# Patient Record
Sex: Male | Born: 1955 | Race: White | Hispanic: No | Marital: Single | Smoking: Former smoker
Health system: Southern US, Community
[De-identification: ages and names within clinical notes are randomized; demographics above are authoritative.]

## PROBLEM LIST (undated history)

## (undated) DIAGNOSIS — I219 Acute myocardial infarction, unspecified: Secondary | ICD-10-CM

## (undated) DIAGNOSIS — D126 Benign neoplasm of colon, unspecified: Secondary | ICD-10-CM

## (undated) DIAGNOSIS — E785 Hyperlipidemia, unspecified: Secondary | ICD-10-CM

## (undated) DIAGNOSIS — T7840XA Allergy, unspecified, initial encounter: Secondary | ICD-10-CM

## (undated) DIAGNOSIS — I2510 Atherosclerotic heart disease of native coronary artery without angina pectoris: Secondary | ICD-10-CM

## (undated) DIAGNOSIS — I10 Essential (primary) hypertension: Secondary | ICD-10-CM

## (undated) HISTORY — DX: Allergy, unspecified, initial encounter: T78.40XA

## (undated) HISTORY — DX: Acute myocardial infarction, unspecified: I21.9

## (undated) HISTORY — PX: CARDIAC CATHETERIZATION: SHX172

## (undated) HISTORY — DX: Essential (primary) hypertension: I10

## (undated) HISTORY — DX: Hyperlipidemia, unspecified: E78.5

## (undated) HISTORY — DX: Atherosclerotic heart disease of native coronary artery without angina pectoris: I25.10

## (undated) HISTORY — PX: VARICOSE VEIN SURGERY: SHX832

## (undated) HISTORY — DX: Benign neoplasm of colon, unspecified: D12.6

---

## 2000-08-22 HISTORY — PX: INGUINAL HERNIA REPAIR: SUR1180

## 2001-04-04 ENCOUNTER — Ambulatory Visit (HOSPITAL_COMMUNITY): Admission: RE | Admit: 2001-04-04 | Discharge: 2001-04-04 | Payer: Self-pay | Admitting: *Deleted

## 2005-01-07 ENCOUNTER — Inpatient Hospital Stay (HOSPITAL_COMMUNITY): Admission: EM | Admit: 2005-01-07 | Discharge: 2005-01-10 | Payer: Self-pay | Admitting: Emergency Medicine

## 2005-01-07 ENCOUNTER — Ambulatory Visit: Payer: Self-pay | Admitting: *Deleted

## 2005-01-20 ENCOUNTER — Ambulatory Visit: Payer: Self-pay | Admitting: Internal Medicine

## 2005-07-27 ENCOUNTER — Ambulatory Visit: Payer: Self-pay | Admitting: Family Medicine

## 2005-08-22 DIAGNOSIS — I219 Acute myocardial infarction, unspecified: Secondary | ICD-10-CM

## 2005-08-22 HISTORY — PX: INGUINAL HERNIA REPAIR: SUR1180

## 2005-08-22 HISTORY — DX: Acute myocardial infarction, unspecified: I21.9

## 2005-09-16 ENCOUNTER — Ambulatory Visit: Payer: Self-pay | Admitting: Cardiology

## 2005-09-16 ENCOUNTER — Ambulatory Visit: Payer: Self-pay | Admitting: Internal Medicine

## 2005-09-28 ENCOUNTER — Ambulatory Visit: Payer: Self-pay | Admitting: Family Medicine

## 2005-10-26 ENCOUNTER — Ambulatory Visit (HOSPITAL_COMMUNITY): Admission: RE | Admit: 2005-10-26 | Discharge: 2005-10-26 | Payer: Self-pay | Admitting: *Deleted

## 2005-11-16 ENCOUNTER — Ambulatory Visit (HOSPITAL_COMMUNITY): Admission: RE | Admit: 2005-11-16 | Discharge: 2005-11-16 | Payer: Self-pay | Admitting: *Deleted

## 2005-12-13 ENCOUNTER — Ambulatory Visit: Payer: Self-pay | Admitting: Family Medicine

## 2006-04-17 ENCOUNTER — Ambulatory Visit: Payer: Self-pay | Admitting: Family Medicine

## 2006-06-16 ENCOUNTER — Ambulatory Visit: Payer: Self-pay | Admitting: Family Medicine

## 2006-08-17 ENCOUNTER — Ambulatory Visit: Payer: Self-pay | Admitting: Family Medicine

## 2006-08-27 ENCOUNTER — Emergency Department (HOSPITAL_COMMUNITY): Admission: EM | Admit: 2006-08-27 | Discharge: 2006-08-27 | Payer: Self-pay | Admitting: Emergency Medicine

## 2006-08-31 ENCOUNTER — Ambulatory Visit: Payer: Self-pay | Admitting: Family Medicine

## 2006-09-07 ENCOUNTER — Ambulatory Visit: Payer: Self-pay | Admitting: Family Medicine

## 2006-09-14 ENCOUNTER — Ambulatory Visit: Payer: Self-pay | Admitting: Family Medicine

## 2006-09-27 ENCOUNTER — Ambulatory Visit: Payer: Self-pay | Admitting: Cardiology

## 2006-10-25 ENCOUNTER — Ambulatory Visit: Payer: Self-pay | Admitting: Family Medicine

## 2006-11-17 ENCOUNTER — Ambulatory Visit: Payer: Self-pay | Admitting: Family Medicine

## 2007-01-16 ENCOUNTER — Ambulatory Visit: Payer: Self-pay | Admitting: Family Medicine

## 2008-11-11 DIAGNOSIS — I2510 Atherosclerotic heart disease of native coronary artery without angina pectoris: Secondary | ICD-10-CM | POA: Insufficient documentation

## 2008-11-11 DIAGNOSIS — E785 Hyperlipidemia, unspecified: Secondary | ICD-10-CM | POA: Insufficient documentation

## 2008-11-11 DIAGNOSIS — I10 Essential (primary) hypertension: Secondary | ICD-10-CM | POA: Insufficient documentation

## 2008-11-12 ENCOUNTER — Ambulatory Visit: Payer: Self-pay | Admitting: Cardiology

## 2008-12-02 ENCOUNTER — Telehealth (INDEPENDENT_AMBULATORY_CARE_PROVIDER_SITE_OTHER): Payer: Self-pay | Admitting: *Deleted

## 2008-12-17 ENCOUNTER — Ambulatory Visit: Payer: Self-pay | Admitting: Vascular Surgery

## 2008-12-31 ENCOUNTER — Ambulatory Visit: Payer: Self-pay | Admitting: Vascular Surgery

## 2009-01-01 ENCOUNTER — Ambulatory Visit: Payer: Self-pay | Admitting: Vascular Surgery

## 2009-01-07 ENCOUNTER — Ambulatory Visit: Payer: Self-pay | Admitting: Vascular Surgery

## 2009-02-20 ENCOUNTER — Ambulatory Visit: Payer: Self-pay | Admitting: Vascular Surgery

## 2011-01-04 ENCOUNTER — Encounter: Payer: Self-pay | Admitting: Cardiology

## 2011-01-04 NOTE — Procedures (Signed)
DUPLEX DEEP VENOUS EXAM - LOWER EXTREMITY   INDICATION:  Follow up left greater saphenous vein ablation.   HISTORY:  Edema:  Left lower extremity.  Trauma/Surgery:  Left greater saphenous vein ablation with stab  phlebectomy on 12/31/08 by Dr. Arbie Cookey.  Pain:  Left thigh pain.  PE:  No.  Previous DVT:  No.  Anticoagulants:  Aspirin, Plavix.  Other:   DUPLEX EXAM:                CFV   SFV   PopV  PTV    GSV                R  L  R  L  R  L  R   L  R  L  Thrombosis    o  o     o     o      o     +  Spontaneous   +  +     +     +      +     0  Phasic        +  +     +     +      +     0  Augmentation  +  +     +     +      +     0  Compressible  +  +     +     +      +     0  Competent     +  +     +     +      +     0   Legend:  + - yes  o - no  p - partial  D - decreased   IMPRESSION:  1. No evidence of deep venous thrombosis in left lower extremity or      right common femoral vein.  2. Evidence of ablation of left greater saphenous vein from knee to      saphenofemoral junction with no evidence of extension into the      common femoral vein.  3. 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      _____________________________  Larina Earthly, M.D.   AS/MEDQ  D:  01/07/2009  T:  01/07/2009  Job:  [id number]

## 2011-01-04 NOTE — Consult Note (Signed)
NEW PATIENT CONSULTATION   Ricky [name], Ricky [name]  DOB:  [birth year]                                       12/17/2008  CHART#:[id number]   The patient presents today for evaluation of his left leg venous  hypertension and bleeding.  He is a pleasant 55 year old gentleman who  had 2 different episodes of bleeding from his medial ankle varicosities.  These were treated with compression.  EMS was called on the second  occasion in January to stop the bleeding.  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  He had no prior history of  superficial thrombophlebitis or deep venous thrombosis.   PAST MEDICAL HISTORY:  Significant for hypertension, elevated blood  pressure and elevated cholesterol.   SOCIAL HISTORY:  He is single.  He is not retired.  He does not smoke or  drink alcohol.   PHYSICAL EXAMINATION:  A well-developed, well-nourished white male  appearing stated age of 46.  His right leg is without varicosities.  Left leg has very large marked varicosities from his mid thigh extending  throughout his medial and lateral calf down on to his medial ankle.  He  does have multiple superficial varicosities in the ankle at the area of  bleeding.   He underwent vascular laboratory studies and this reveals gross reflux  throughout the saphenous vein.  He has no evidence of deep venous  reflux.   I discussed this at length with the patient.  He is an excellent  candidate for ablation of his great saphenous vein and stab phlebectomy  of his multiple large tributaries for reduction of his venous  hypertension.  I explained the procedure to the patient including the  fact that this is done in the office under local anesthesia and that he  would walk in, have the treatment and then be able to be ambulatory  immediately.  He really has not had pain associated with this and reason  for treatment would be to reduce his risk for rebleeding.  I did  explain  that we would use sclerotherapy to treat the areas that had bled to  further reduce his risk for continued bleeding difficulty.  He wishes to  proceed and we will do this as soon as we have ensured insurance  coverage.   Larina Earthly, M.D.  Electronically Signed   TFE/MEDQ  D:  12/17/2008  T:  12/18/2008  Job:  [id number]   cc:   Rollene Rotunda, MD, [facility]

## 2011-01-04 NOTE — Assessment & Plan Note (Signed)
OFFICE VISIT   Kipnis, Marik L  DOB:  1956/06/07                                       02/20/2009  CHART#:[id number]   Rishav Rockefeller presents today for followup of his laser ablation, stab  phlebectomy, and also sclerotherapy of tributary branches of his ankle  that had bleeding.  This procedure was done on Dec 31, 2008.  He did  well initially and continued to do quite well.  He does have the usual  amount of thickness over the sclerotherapy site with occlusion of the  same.  He has no discomfort in the area of prior bleeding with the  reticular and telangiectasias in his ankle have completely thrombosed  and resolved with sclerotherapy.  I am quite pleased with his result as  is Mr. Vieth.  He will see [place] again on an as-needed basis.   Larina Earthly, M.D.  Electronically Signed   TFE/MEDQ  D:  02/20/2009  T:  02/24/2009  Job:  [id number]

## 2011-01-04 NOTE — Procedures (Signed)
LOWER EXTREMITY VENOUS REFLUX EXAM   INDICATION:  Left lower extremity varicose vein with pain and swelling.   EXAM:  Using color-flow imaging and pulse Doppler spectral analysis, the  left common femoral, superficial femoral, popliteal, posterior tibial,  greater and lesser saphenous veins are evaluated.  There is no evidence  suggesting deep venous insufficiency in the left lower extremity.   The left saphenofemoral junction is competent.  The left GSV is not  competent with the caliber as described below.   The left proximal short saphenous vein demonstrates competency.   GSV Diameter (used if found to be incompetent only)                                            Right    Left  Proximal Greater Saphenous Vein           cm       0.93 cm  Proximal-to-mid-thigh                     cm       0.93 cm  Mid thigh                                 cm       0.93 cm  Mid-distal thigh                          cm       0.93 cm  Distal thigh                              cm       0.33 cm  Knee                                      cm       0.40 cm   IMPRESSION:  1. Left greater saphenous vein reflux is identified with the caliber      ranging from 0.40 cm to 1.15 cm knee to groin.  2. The left greater saphenous vein is not aneurysmal.  3. The left greater saphenous vein is not tortuous.  4. The left deep venous system is competent.  5. The left lesser saphenous vein is competent.  6. No evidence of deep venous thrombosis noted in the left leg.    ___________________________________________  Larina Earthly, M.D.   MG/MEDQ  D:  12/17/2008  T:  12/17/2008  Job:  [id number]

## 2011-01-04 NOTE — Assessment & Plan Note (Signed)
[facility]                            CARDIOLOGY OFFICE NOTE   NAME:Ricky [name], Ricky [name]                       MRN:          [id number]  DATE:11/12/2008                            DOB:          03/28/1956    PRIMARY CARE PHYSICIAN:  Delaney Meigs, MD   REASON FOR PRESENTATION:  Evaluate the patient with coronary disease  with a non-Q-wave myocardial infarction.   HISTORY OF PRESENT ILLNESS:  The patient is now 55 years old.  He has  done well since I last saw him.  He remains active, working as a  Copy.  With this, he denies any chest discomfort, neck or arm  discomfort.  He has had no palpitations, presyncope, or syncope.  He has  had no PND or orthopnea.   He has had couple of episodes where small varicose veins in his foot  have blood.  He said this is fairly profuse.  He has had to call EMS  though he has not been hospitalized with this.   PAST MEDICAL HISTORY:  1. Nonobstructive coronary disease (circumflex 30% stenosed).  2. Well-preserved ejection fraction.  3. Dyslipidemia.  4. 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  5. Hypertension.  6. Inguinal hernia repair.   ALLERGIES:  None.   MEDICATIONS:  1. Aspirin 81 mg daily.  2. Plavix 75 mg daily.  3. Lopressor 25 mg b.i.d.  4. Diovan 320/25 daily.  5. Zetia 10 mg daily.  6. WelChol 625 mg daily.   REVIEW OF SYSTEMS:  As stated in the HPI and otherwise negative for  other systems.   PHYSICAL EXAMINATION:  GENERAL:  The patient is pleasant, in no  distress.  VITAL SIGNS:  Blood pressure 160/90, heart rate 59 and regular, weight  174 pounds, and body mass index 25.  HEENT:  Eyelids unremarkable.  Pupils equal, round, and reactive to  light.  Fundi not visualized.  Oral mucosa unremarkable.  NECK:  No jugular venous distention at 45 degrees.  Carotid upstroke  brisk and symmetric.  No bruits.  No thyromegaly.  LYMPHATICS:  No cervical,  axillary, or inguinal adenopathy.  LUNGS:  Clear to auscultation bilaterally.  BACK:  No costovertebral angle tenderness.  CHEST:  Unremarkable.  HEART:  PMI not displaced or sustained.  S1 and S2 within normal limits.  No S3, no S4.  No clicks, no rubs, no murmurs.  ABDOMEN:  Flat, positive bowel sounds, normal in frequency and pitch.  No bruits, no rebound, no guarding.  No midline pulsatile mass.  No  hepatomegaly.  No splenomegaly.  SKIN:  No rashes, no nodules.  EXTREMITIES:  2+ pulses throughout.  No edema, no cyanosis, no clubbing.  NEUROLOGIC:  Oriented to person, place, and time.  Cranial nerves II-XII  grossly intact.  Motor grossly intact.   EKG, sinus rhythm, rate 59, axis within normal limits, intervals within  normal limits, early transition lead V2, no acute ST-T wave changes.   ASSESSMENT AND PLAN:  1. Non-Q-wave myocardial infarction.  The patient is having no  symptoms.  No further cardiovascular testing is suggested.  He will      continue with the risk reduction.  2. Dyslipidemia.  This is followed by Dr. Lysbeth Galas.  The goal LDL less      than 100 and HDL greater than 50.  3. Hypertension.  His blood pressure is elevated today.  However, he      says he gets to check routinely at work and it is fine.  It is well      below 140/90.  Therefore, I will make no change to his medical      regimen.  4. Bleeding varicose veins.  I have given him a referral to the      vascular and vein specialists.  Since he has had 2 bleeding      episodes, a procedure to treat these may be indicated.  If felt      necessary, he could come off his Plavix though I would want him to      continue aspirin.  5. Followup.  I will see him again in about 18 months or sooner if      needed.     Rollene Rotunda, MD, [facility]  Electronically Signed    JH/MedQ  DD: 11/12/2008  DT: 11/13/2008  Job #: [id number]   cc:   Delaney Meigs, M.D.

## 2011-01-04 NOTE — Assessment & Plan Note (Signed)
OFFICE VISIT   [name], Ricky L  DOB:  July 25, 1956                                       01/07/2009  CHART#:[id number]   The patient presents today in 1 week followup for left leg laser  ablation and stab phlebectomy of tributary varicosities and  sclerotherapy of the area of telangiectasia around his ankles which had  caused prior bleeding.  He has the usual amount of discomfort over the  ablation site in his thigh.  He has minimal discomfort over the stab  phlebectomy sites in his calf.   He underwent duplex and this shows no evidence of DVT and does show  successful ablation from below his knee up to the saphenofemoral  junction.  I am pleased with his initial result and plan to see him  again in 6 weeks for final followup.   Larina Earthly, M.D.  Electronically Signed   TFE/MEDQ  D:  01/07/2009  T:  01/08/2009  Job:  [id number]   cc:   Rollene Rotunda, MD, [facility]

## 2011-01-05 ENCOUNTER — Ambulatory Visit (INDEPENDENT_AMBULATORY_CARE_PROVIDER_SITE_OTHER): Payer: Self-pay | Admitting: Cardiology

## 2011-01-05 ENCOUNTER — Encounter: Payer: Self-pay | Admitting: Cardiology

## 2011-01-05 DIAGNOSIS — E785 Hyperlipidemia, unspecified: Secondary | ICD-10-CM

## 2011-01-05 DIAGNOSIS — I10 Essential (primary) hypertension: Secondary | ICD-10-CM

## 2011-01-05 DIAGNOSIS — I2510 Atherosclerotic heart disease of native coronary artery without angina pectoris: Secondary | ICD-10-CM

## 2011-01-05 NOTE — Assessment & Plan Note (Signed)
The blood pressure is at target. No change in medications is indicated. We will continue with therapeutic lifestyle changes (TLC).  

## 2011-01-05 NOTE — Patient Instructions (Signed)
Follow up as needed  Continue current medications  

## 2011-01-05 NOTE — Assessment & Plan Note (Signed)
Per Josue Hector, MD

## 2011-01-05 NOTE — Progress Notes (Signed)
HPI The patient presents for followup of a previous non-Q-wave myocardial infarction in 2006. I reviewed the catheterization from that time and was found to have normal coronaries. I haven't seen him in a couple of years. In that time he has done well. He continues to work as a Copy.  He has had no cardiovascular symptoms. He denies any chest pressure, neck or arm discomfort. He has had no palpitations, presyncope or syncope. He has had no shortness of breath, PND or orthopnea. He has no weight gain or edema.  No Known Allergies  Current Outpatient Prescriptions  Medication Sig Dispense Refill  . aspirin 81 MG tablet Take 81 mg by mouth daily.        . clopidogrel (PLAVIX) 75 MG tablet Take 75 mg by mouth daily.        . colesevelam (WELCHOL) 625 MG tablet Take 1,875 mg by mouth. 3 po bid       . ezetimibe (ZETIA) 10 MG tablet Take 10 mg by mouth daily.        . metoprolol (LOPRESSOR) 50 MG tablet Take 50 mg by mouth 2 (two) times daily.        . Pitavastatin Calcium (LIVALO) 2 MG TABS Take by mouth. 1 po daily       . valsartan-hydrochlorothiazide (DIOVAN-HCT) 320-25 MG per tablet Take 1 tablet by mouth daily.          Past Medical History  Diagnosis Date  . CAD (coronary artery disease)     Normal coronaries (2006)  . Dyslipidemia   . Hypertension     Past Surgical History  Procedure Date  . Varicose vein surgery   . Inguinal hernia repair     ROS:  As stated in the HPI and negative for all other systems.  PHYSICAL EXAM BP 132/82  Pulse 63  Resp 16  Ht 5\' 8"  (1.727 m)  Wt 175 lb (79.379 kg)  BMI 26.61 kg/m2 GENERAL:  Well appearing HEENT:  Pupils equal round and reactive, fundi not visualized, oral mucosa unremarkable NECK:  No jugular venous distention, waveform within normal limits, carotid upstroke brisk and symmetric, no bruits, no thyromegaly LYMPHATICS:  No cervical, inguinal adenopathy LUNGS:  Clear to auscultation bilaterally BACK:  No CVA tenderness,  scoliosis CHEST:  Unremarkable HEART:  PMI not displaced or sustained,S1 and S2 within normal limits, no S3, no S4, no clicks, no rubs, no murmurs ABD:  Flat, positive bowel sounds normal in frequency in pitch, no bruits, no rebound, no guarding, no midline pulsatile mass, no hepatomegaly, no splenomegaly EXT:  2 plus pulses throughout, no edema, no cyanosis no clubbing SKIN:  No rashes no nodules NEURO:  Cranial nerves II through XII grossly intact, motor grossly intact throughout PSYCH:  Cognitively intact, oriented to person place and time  EKG:  Sinus rhythm, rate 62, early transition in lead V2, no acute ST-T wave changes  ASSESSMENT AND PLAN

## 2011-01-05 NOTE — Assessment & Plan Note (Signed)
The patient had no coronary disease though he had a non-Q-wave MI in the past. At this point no further testing is suggested. He can stop his Plavix but will continue with risk reduction.

## 2011-01-07 NOTE — Cardiovascular Report (Signed)
NAMEMANOLITO, JUREWICZ                ACCOUNT NO.:  [id number]   MEDICAL RECORD NO.:  [id number]          PATIENT TYPE:  INP   LOCATION:  3311                         FACILITY:  [facility]   PHYSICIAN:  Salvadore Farber, M.D. LHCDATE OF BIRTH:  08/18/1956   DATE OF PROCEDURE:  01/10/2005  DATE OF DISCHARGE:                              CARDIAC CATHETERIZATION   PROCEDURE:  Left heart catheterization, left ventriculography, coronary  angiography.   INDICATIONS:  Mr. Paulsen is a 55 year old gentleman with hypertension who  presented on Jan 07, 2005 with chest pain.  He ruled in for small mild ST  elevation myocardial infarction with troponin rising to 1.  He was referred  for diagnostic angiography.   PROCEDURAL TECHNIQUE:  Informed consent was obtained.  Under 1% lidocaine  local anesthesia a 5-French sheath was placed in the right common femoral  artery using the modified Seldinger technique.  Diagnostic angiography and  ventriculography were performed using JL4, no-torque right, and pigtail  catheters.  Patient tolerated the procedure well was transferred to the  holding room in stable condition.  Sheaths will be removed there.   COMPLICATIONS:  None.   FINDINGS:  1.  LV 131/4/10.  EF 65% without regional wall motion abnormalities.  2.  No aortic stenosis or mitral regurgitation.  3.  Left main:  Angiographically normal.  4.  LAD:  Large vessel wrapping the apex of the heart and supplied the      distal third of the inferior wall.  It gives rise to a single large      diagonal branch.  It is angiographically normal.  5.  Circumflex:  Large, dominant vessel giving rise to a single obtuse      marginal and the PDA.  It is angiographically normal.  6.  RCA:  Small, nondominant vessel.  There was catheter induced spasm at      the ostium which was relieved with intracoronary nitroglycerin.  The      vessel was angiographically normal.   </IMPRESSION/RECOMMENDATIONS/  Patient has  minimal, nonobstructive coronary disease.  I suspect that his  small non ST elevation myocardial infarction was caused either by plaque  rupture versus coronary spasm.  Will treat with combination of aspirin and  Plavix for a year per the CURE data.  Continue beta blocker.      WED/MEDQ  D:  01/10/2005  T:  01/10/2005  Job:  [id number]   cc:   Rollene Rotunda, M.D.   Ernestina Penna, M.D.  [address] [place]  [place] [address]  Fax: [phone]

## 2011-01-07 NOTE — Assessment & Plan Note (Signed)
[facility]                            CARDIOLOGY OFFICE NOTE   NAME:Ricky [name], Ricky [name]                       MRN:          [id number]  DATE:09/27/2006                            DOB:          1956/02/17    PRIMARY CARE PHYSICIAN:  Delaney Meigs, M.D.   REASON FOR PRESENTATION:  Evaluate patient with non-Q-wave myocardial  infarction.   HISTORY OF PRESENT ILLNESS:  Patient is 55 years old.  He presents for  yearly followup.  He had a history of a non-Q-wave myocardial infarction  in May, 2006 with nonobstructive coronary disease diagnosed at that  time.  Since then, he has been managed with risk reduction.  He had not  had any recurrent chest discomfort, neck or arm discomfort.  He had no  palpitations, presyncope, or syncope.  He has no PND or orthopnea.  He  works 12 hours a day.  He does not exercise routinely.   Of note, he did have an episode of acute joint pains last month and  presented to the ER.  It was felt possibly to be an acute arthritic  event.  He was treated with Percocet and Celebrex.  The patient did stop  his Lipitor, and symptoms slowly resolved.  He wonders if the drug might  not have been related, and he has not restarted it.   PAST MEDICAL HISTORY:  Nonobstructive coronary artery disease  (circumflex 30% stenosis).  Well-preserved ejection fraction,  dyslipidemia, congenital bony abnormality (there is no name associated  to this).  Hypertension.  Inguinal hernia repair.   ALLERGIES:  None.   CURRENT MEDICATIONS:  1. Aspirin 81 mg daily.  2. Plavix 75 mg daily.  3. Lopressor 25 mg b.i.d.  4. Diovan/HCT 160/12.5 daily.   REVIEW OF SYSTEMS:  As stated in the HPI, otherwise negative for other  systems.   PHYSICAL EXAMINATION:  GENERAL:  Patient is pleasant.  He is in no  distress.  VITAL SIGNS:  Blood pressure 142/92, heart rate 69 and regular.  Weight  162 pounds.  Body Mass Index 24.  HEENT:  Eyelids unremarkable.   Pupils are equal, round and reactive to  light.  Fundi not visualized.  NECK:  No jugular venous distention at 45 degrees.  Carotid upstroke  brisk and symmetric.  No bruits, no thyromegaly.  LYMPHATICS:  No cervical, axillary, or inguinal adenopathy.  LUNGS:  Clear to auscultation bilaterally.  BACK:  Severe scoliosis.  HEART:  PMI not displaced or sustained.  S1 and S2 within normal limits.  No S3, no S4, no clicks, rubs, or murmurs.  ABDOMEN:  Flat, positive bowel sounds.  Normal in frequency and pitch.  No bruits, rebound, guarding.  There are no midline pulsatile masses.  No hepatomegaly, splenomegaly.  SKIN:  No rashes, no nodules.  EXTREMITIES:  Pulses 2+ throughout.  No cyanosis, no clubbing, no edema.  NEUROLOGIC:  Alert and oriented to person, place, and time.  Cranial  nerves II-XII are grossly intact.  Motor grossly intact throughout.   EKG:  Sinus rhythm, rate 69.  Axis within normal limits.  Intervals  within normal limits.  No acute ST-T wave changes.   ASSESSMENT/PLAN:  1. Coronary artery disease:  Patient has no new symptoms consistent      with progression of his coronary disease.  We discussed risk      reduction.  I have given him a prescription for exercising five      days per week.  2. Dyslipidemia:  The patient has some congenital bony abnormality.  I      wonder if he is set up for arthritis and would suggest that this      probably was the problem more than his 10 mg of Lipitor.  He is      going to remain off of this and see Dr. Lysbeth Galas next month.  I would      suggest a trial of pravastatin as a next therapy or restarting the      Lipitor.  3. Followup:  Patient is to come back to this clinic every 18 months      or as needed.     Rollene Rotunda, MD, [facility]  Electronically Signed    JH/MedQ  DD: 09/27/2006  DT: 09/27/2006  Job #: [id number]   cc:   Delaney Meigs, M.D.

## 2011-01-07 NOTE — Op Note (Signed)
NAMEJAZ, Ricky [name]                ACCOUNT NO.:  [id number]   MEDICAL RECORD NO.:  [id number]          PATIENT TYPE:  AMB   LOCATION:  SDS                          FACILITY:  [facility]   PHYSICIAN:  Alfonse Ras, MD   DATE OF BIRTH:  06-19-1956   DATE OF PROCEDURE:  11/16/2005  DATE OF DISCHARGE:  11/16/2005                                 OPERATIVE REPORT   PREOPERATIVE DIAGNOSIS:  Left inguinal hernia.   POSTOPERATIVE DIAGNOSIS:  Left inguinal hernia.   PROCEDURES:  Left inguinal hernia repair with mesh.   SURGEON:  Alfonse Ras, MD   ANESTHESIA:  General.   DESCRIPTION:  The patient was taken to the operating room and placed in the  supine position.  After adequate general anesthesia was induced using  laryngeal mask, the left groin was prepped and draped in a normal sterile  fashion.  Using an oblique incision over the inguinal canal, I dissected  down on the external oblique fascia which was quite attenuated.  It was  opened along its fibers.  Spermatic cord was surrounded at the pubic  tubercle with a Penrose drain.  A direct hernia defect was identified, and  sac was dissected free and reduced into the abdominal cavity.  A fascial-  splitting incision was made because the fascia was quite tense and  attenuated on both sides.  The transversalis fascia was approximated to the  shelving edge of Cooper ligament and the inguinal ligament with interrupted  #1 Surgilon.  This closed the floor of Hesselbach triangle very nicely.  This was reinforced with a piece of Prolene mesh which was sutured with a 2-  0 Prolene from the pubic tubercle along the transversalis fascia, along the  inguinal ligament, split, and brought out lateral to the internal ring and  tacked laterally.  There was no evidence of an indirect hernia sac.  The  external oblique fascia was then closed as well as I could with a running 3-  0 Vicryl suture.  Skin was closed with staples.  Tissues were injected  with  0.5 Marcaine.  The patient tolerated the procedure well and went to PACU in  good condition.      Alfonse Ras, MD  Electronically Signed     KRE/MEDQ  D:  11/16/2005  T:  11/17/2005  Job:  [id number]

## 2011-01-07 NOTE — H&P (Signed)
NAMEJURIS, GOSNELL                ACCOUNT NO.:  [id number]   MEDICAL RECORD NO.:  [id number]          PATIENT TYPE:  INP   LOCATION:  3311                         FACILITY:  [facility]   PHYSICIAN:  Rollene Rotunda, M.D.   DATE OF BIRTH:  August 21, 1956   DATE OF ADMISSION:  01/07/2005  DATE OF DISCHARGE:                                HISTORY & PHYSICAL   PRIMARY CARE PHYSICIAN:  Dr. Christell Constant   CHIEF COMPLAINT:  Chest pain.   HISTORY OF PRESENT ILLNESS:  Ricky [name] is a 55 year old with past medical  history significant for hypertension who presents to the emergency room with  a substernal left-sided chest pain since 12:00 this afternoon.  Patient  stated that he was mowing his lawn today when after 30 minutes developed  some left-sided substernal chest pain.  Patient describes the chest pain as  a pressure rise.  Patient denies any radiation.  No shortness of breath.  No  nausea.  No vomiting.  No abdominal pain.  No headache.  Patient states that  he did have some fatigue.  Patient then went to his primary care physician's  office, Dr. Christell Constant, who then obtained an EKG, gave the patient two  nitroglycerin tablets sublingual which improved patient's chest pain.  Patient was then transferred by EMS to the [facility] Emergency  Room.  During transfer patient was given four baby aspirin and transferred.  Patient is currently without any chest pain.   PAST MEDICAL HISTORY:  1.  Hypertension.  2.  Prior catheterization note.  3.  CABG No   ALLERGIES:  None known drug allergies.   ALLERGIES:  No known drug allergies.  No allergies to contrast.  No  allergies to shellfish.   MEDICATIONS:  1.  Diovan/HCTZ 160/12.5 mg p.o. daily.  2.  Aspirin 325 mg p.o. daily.   SOCIAL HISTORY:  Patient lives in [place] with his mother.  Patient works  at [employer] and also at [employer].  Patient is single.  Denies any tobacco use.  No alcohol use.  No IV drug use.  No cocaine  drug  use.  The patient has taken aspirin from time to time.   FAMILY HISTORY:  Patient's mother is alive and healthy and 23 years old.  Only past medical history is cataract.  Patient's father passed away at age  46 and was died in a house fire.  Patient does have two sisters.  One is 52  status post breast cancer and the other is 50 who is healthy.   REVIEW OF SYSTEMS:  Pretty much unremarkable except stated per HPI which  includes the substernal left-sided chest pain, palpitations, sore throat x2  days, now resolved prior to admission.   PHYSICAL EXAMINATION:  VITAL SIGNS:  Temperature 97.7, pulse 92, respiratory  rate 18, blood pressure 149/79, saturating 98% on room air.  GENERAL:  Patient is well-developed, well-nourished white male.  HEENT:  Normocephalic, atraumatic.  Pupils are equal, round, and reactive to  light.  Extraocular movements intact.  Sclera is clear.  Tympanic membranes  are clear.  Oropharynx  is clear without exudate.  No erythema and moist.  NECK:  Supple with no lymphadenopathy, no JVD, no carotid bruits.  ABDOMEN:  Soft, nontender, nondistended.  Normal bowel sounds.  No rebound  and no guarding.  No hepatosplenomegaly.  CARDIOVASCULAR:  Heart is regular rate and rhythm with no murmurs, rubs, or  gallops.  Normal PMI.  No bruits.  No JVD.  Pulses 2+ equally and  bilaterally.  RESPIRATORY:  Lungs are clear to auscultation bilaterally without any  wheezes, rhonchi, or rales.  EXTREMITIES:  No clubbing, cyanosis, edema.  No rash.  No lesions . No  petechiae.  SKIN:  Without any rashes.  NEUROLOGIC:  Patient is alert and oriented x3.  Cranial nerves II-XII are  grossly intact.  5/5 upper extremity strength bilaterally.  5/5 lower  extremity strength bilaterally.  Normal sensation throughout.  Normal  cerebellar function.  BACK:  Patient has kyphosis on the right side of his back.   EKG had a rate of 98, normal sinus rhythm.  PR interval 142.  QRS interval   100.  QTc 447.  There was no evidence of hypertrophy and T-wave inversion in  leads V1 and V2.   ASSESSMENT/PLAN:  1.  Chest pain/acute coronary syndrome.  Patient with a positive troponin on      point of care markers of 0.19.  Patient will be admitted to the stepdown      unit.  Patient is currently chest pain-free.  Will cycle enzymes q.8h.      x3.  Start patient on Lopressor 5 mg intravenous x1 and Lopressor 25 mg      p.o. b.i.d.  Heparin drip per pharmacy.  Nitroglycerin drip starting at      5 mL per minute.  Will check a PT, PTT, INR.  Check a TSH, CMET, CBC.      Also, give the patient aspirin 325 mg p.o. daily.  Will check a fasting      lipid in the morning and patient will be scheduled for a cardiac      catheterization on Monday, Jan 10, 2005.  2.  Hypertension.  Patient will be maintained on Lopressor 25 mg p.o. b.i.d.      and also on a nitroglycerin drip.      DT/MEDQ  D:  01/07/2005  T:  01/07/2005  Job:  [id number]

## 2011-01-07 NOTE — Discharge Summary (Signed)
NAMEORYAN, Ricky [name]                ACCOUNT NO.:  [id number]   MEDICAL RECORD NO.:  [id number]          PATIENT TYPE:  INP   LOCATION:  3311                         FACILITY:  [facility]   PHYSICIAN:  Salvadore Farber, M.D. LHCDATE OF BIRTH:  Jun 15, 1956   DATE OF ADMISSION:  01/07/2005  DATE OF DISCHARGE:  01/10/2005                                 DISCHARGE SUMMARY   PRINCIPAL DIAGNOSIS:  Acute coronary syndrome/non ST elevation myocardial  infarction.   OTHER DIAGNOSES:  Hypertension.   PROCEDURE:  Left heart cardiac catheterization.   ALLERGIES:  NO KNOWN DRUG ALLERGIES.   HISTORY OF PRESENT ILLNESS:  A 55 year old white male with a past medical  history significant for hypertension who presented to the [facility]  Emergency Room at approximately 12 noon on Jan 07, 2005, with complaints of  nonradiating substernal chest pressure without associated symptoms.  He  initially presented to his primary care physician's office, Dr. Christell Constant, in  [place], who then obtained an EKG with nonspecific changes and he was  treated with two sublingual nitroglycerin with improvement in chest pain.  He was transferred by EMS to the [facility] ED and at that point, where he  received baby aspirin, by time of arrival he was pain free.  He was noted to  have elevated troponin by point of care testing at 0.19.  He was, therefore,  admitted to stepdown for further evaluation and management of acute coronary  syndrome.   HOSPITAL COURSE:  The patient eventually peaked his CK at 290 MB at 13.2 and  troponin-I at 1.61.  He was managed with beta-blocker, aspirin, Plavix,  heparin and nitrate and did not have any recurrent chest discomfort.  He  underwent left heart cardiac catheterization on May 22nd which revealed an  EF of 65% with normal left main, normal LAD, 30% lesion in the mid  circumflex, and a normal RCA.  It was believed that his acute coronary  syndrome was likely secondary to either a resolved  plaque rupture vs.  coronary vasospasm.  Decision was made to maintain aspirin and Plavix  therapy x1 year as well as beta-blocker and statin.  The patient has not had  any recurrent chest discomfort during this admission and has been ambulating  post catheterization without difficulty.  He is being discharged home today  in satisfactory condition.   DISCHARGE LABS:  Hemoglobin 14.3, hematocrit 41.5, WBC 7.3, platelets 205.  Sodium 138, potassium 3.5, chloride 103, CO2 26, BUN 10, creatinine 0.8,  glucose 195.  Total bilirubin 0.6, alkaline phosphatase 100, AST 51, ALT 47,  peak CK 290, peak MB 13.2, peak troponin I 1.61, total cholesterol is 172,  triglycerides 96, HDL 46, LDL 107.  Calcium 9.1, magnesium 2.1, TSH 0.896,  hemoglobin A1c is pending.   DISCHARGE PHYSICAL EXAM:  Temperature is 98.0, heart rate 72, respirations  12, blood pressure is 122/68.  A pleasant white male in no acute distress.  Awake, alert and oriented x3.  NECK:  Normal carotid upstrokes, no bruits or JVD.  LUNGS:  Respirations regular, nonlabored.  Clear to auscultation.  CARDIAC:  Regular S1, S2.  No S3, S4 or murmurs.  ABDOMEN:  Round, soft, nontender, nondistended.  Bowel sounds present x4.  EXTREMITIES:  Warm, dry, pink.  No clubbing, cyanosis or edema.  Dorsalis  pedis, posterior tibial pulses 2+ and equal bilaterally.  The right groin  site which is used for cath is clear of bleeding, bruising, hematoma.   DISPOSITION:  The patient is being discharged home in good condition.   FOLLOWUP APPOINTMENT:  He is to followup with [facility] for groin  check in 2 weeks, on January 20, 2005, at 12:30 p.m. He is asked to followup  with his primary care physician, Dr. Christell Constant, in [place], in 1-2 weeks.  He  will need closer followup regarding his elevated blood glucose and currently  his hemoglobin A1c is pending here.   DISCHARGE MEDICATIONS:  1.  Aspirin 81 mg daily.  2.  Plavix 75 mg daily.  3.  Lipitor 10  mg q.h.s.  4.  Lopressor 25 mg b.i.d.  5.  Nitroglycerin 0.4 mg sublingual p.r.n. chest pain.   PENDING LAB STUDIES:  Hemoglobin A1c.   DURATION OF DISCHARGE ENCOUNTER:  45 minutes.      CRB/MEDQ  D:  01/10/2005  T:  01/10/2005  Job:  [id number]   cc:   Ernestina Penna, M.D.  [address] [place]  [place] [address]  Fax: [phone]

## 2011-01-07 NOTE — Op Note (Signed)
[facility]  Patient:    Ricky [name], Ricky [name]                     MRN: [id number] Proc. Date: 03/04/01 Adm. Date:  [id number] Attending:  Vikki Ports.                           Operative Report  PREOPERATIVE DIAGNOSIS:  Right inguinal hernia.  POSTOPERATIVE DIAGNOSIS:  Right inguinal hernia.  PROCEDURE:  Right inguinal hernia repair with mesh.  SURGEON:  Catalina Lunger, M.D.  ANESTHESIA:  General.  DESCRIPTION OF PROCEDURE:  The patient was taken to the operating room and placed in the supine position.  After adequate general anesthesia was induced, the right lower quadrant was prepped and draped in the normal sterile fashion. Using an oblique incision overlying the inguinal canal, I dissected down onto the external oblique fascia.  This was opened along its fibers down through the external ring.  The spermatic cord was identified and surrounded.  Direct hernia defect was identified, sac was reduced into the preperitoneal space.  A large Prolene hernia system was then brought to the table.  The preperitoneal space was completely dissected free.  The posterior portion of the hernia system was then placed within the hernia defect and the preperitoneum, and the onlay was laid over the floor of Hesselbachs triangle.  The onlay was tacked medially to the pubic tubercle using a 2-0 Prolene inferiorly to Coopers ligament, split, and brought out lateral to the mesh, tacked out laterally, and tacked to the transversalis fascia superiorly.  Adequate hemostasis was ensured.  All tissues were injected using 0.5 Marcaine.  External oblique fascia was closed with a running 3-0 Vicryl.  Skin was closed with staples. The patient tolerated the procedure well and went to PACU in good condition. DD:  04/04/01 TD:  04/04/01 Job: [id number]

## 2011-04-19 ENCOUNTER — Ambulatory Visit (INDEPENDENT_AMBULATORY_CARE_PROVIDER_SITE_OTHER): Payer: BC Managed Care – PPO | Admitting: Gastroenterology

## 2011-04-19 ENCOUNTER — Encounter: Payer: Self-pay | Admitting: Gastroenterology

## 2011-04-19 VITALS — BP 122/70 | HR 56 | Temp 97.8°F | Ht 67.0 in | Wt 174.8 lb

## 2011-04-19 DIAGNOSIS — Z1211 Encounter for screening for malignant neoplasm of colon: Secondary | ICD-10-CM

## 2011-04-19 NOTE — Progress Notes (Signed)
Primary Care Physician:  Josue Hector, MD  Primary Gastroenterologist:  Roetta Sessions, MD   Chief Complaint  Patient presents with  . Colonoscopy    HPI:  Ricky [name] is a 55 y.o. male here to schedule colonoscopy. No prior colonoscopy. No FH of CRC. Sister died with breast cancer at age 20. Denies constipation, diarrhea, melena, brbpr, abd pain, n/v, heartburn, dysphagia, weight loss.   Current Outpatient Prescriptions  Medication Sig Dispense Refill  . aspirin 81 MG tablet Take 81 mg by mouth daily.        . clopidogrel (PLAVIX) 75 MG tablet Take 75 mg by mouth daily.        . colesevelam (WELCHOL) 625 MG tablet Take 1,875 mg by mouth. 3 po bid       . ezetimibe (ZETIA) 10 MG tablet Take 10 mg by mouth daily.        . metoprolol (LOPRESSOR) 50 MG tablet Take 50 mg by mouth 2 (two) times daily.        . Pitavastatin Calcium (LIVALO) 2 MG TABS Take by mouth. 1 po daily       . valsartan-hydrochlorothiazide (DIOVAN-HCT) 320-25 MG per tablet Take 1 tablet by mouth daily.          Allergies as of 04/19/2011  . (No Known Allergies)    Past Medical History  Diagnosis Date  . CAD (coronary artery disease)     Normal coronaries (2006)  . Dyslipidemia   . Hypertension   . MI (myocardial infarction) 2007    Past Surgical History  Procedure Date  . Varicose vein surgery   . Inguinal hernia repair 2007    left  . Inguinal hernia repair 2002    right    Family History  Problem Relation Age of Onset  . Breast cancer Sister 69    deceased  . Colon cancer Neg Hx   . Liver disease Neg Hx     History   Social History  . Marital Status: Single    Spouse Name: N/A    Number of Children: N/A  . Years of Education: N/A   Occupational History  . Not on file.   Social History Main Topics  . Smoking status: Former Smoker    Quit date: 08/22/1994  . Smokeless tobacco: Not on file  . Alcohol Use: No  . Drug Use: No  . Sexually Active: Not on file   Other  Topics Concern  . Not on file   Social History Narrative  . No narrative on file      ROS:  General: Negative for anorexia, weight loss, fever, chills, fatigue, weakness. Eyes: Negative for vision changes.  ENT: Negative for hoarseness, difficulty swallowing , nasal congestion. CV: Negative for chest pain, angina, palpitations, dyspnea on exertion, peripheral edema.  Respiratory: Negative for dyspnea at rest, dyspnea on exertion, cough, sputum, wheezing.  GI: See history of present illness. GU:  Negative for dysuria, hematuria, urinary incontinence, urinary frequency, nocturnal urination.  MS: Negative for joint pain, low back pain.  Derm: Negative for rash or itching.  Neuro: Negative for weakness, abnormal sensation, seizure, frequent headaches, memory loss, confusion.  Psych: Negative for anxiety, depression, suicidal ideation, hallucinations.  Endo: Negative for unusual weight change.  Heme: Negative for bruising or bleeding. Allergy: Negative for rash or hives.    Physical Examination:  BP 122/70  Pulse 56  Temp(Src) 97.8 F (36.6 C) (Temporal)  Ht 5\' 7"  (1.702 m)  Wt 174  lb 12.8 oz (79.289 kg)  BMI 27.38 kg/m2   General: Well-nourished, well-developed in no acute distress.  Head: Normocephalic, atraumatic.   Eyes: Conjunctiva pink, no icterus. Mouth: Oropharyngeal mucosa moist and pink , no lesions erythema or exudate. Neck: Supple without thyromegaly, masses, or lymphadenopathy.  Lungs: Clear to auscultation bilaterally.  Heart: Regular rate and rhythm, no murmurs rubs or gallops.  Abdomen: Bowel sounds are normal, nontender, nondistended, no hepatosplenomegaly or masses, no abdominal bruits or    hernia , no rebound or guarding.   Rectal: Defer to time of colonoscopy. Extremities: No lower extremity edema. No clubbing or deformities.  Neuro: Alert and oriented x 4 , grossly normal neurologically.  Skin: Warm and dry, no rash or jaundice.   Psych: Alert and  cooperative, normal mood and affect.

## 2011-04-19 NOTE — Assessment & Plan Note (Signed)
Never had a colonoscopy. Has to have one to keep from having increase in insurance premium, per patient. Colonoscopy in near future.  I have discussed the risks, alternatives, benefits with regards to but not limited to the risk of reaction to medication, bleeding, infection, perforation and the patient is agreeable to proceed. Written consent to be obtained.

## 2011-04-20 NOTE — Progress Notes (Signed)
Cc to PCP 

## 2011-04-23 DIAGNOSIS — D126 Benign neoplasm of colon, unspecified: Secondary | ICD-10-CM

## 2011-04-23 HISTORY — DX: Benign neoplasm of colon, unspecified: D12.6

## 2011-05-02 MED ORDER — SODIUM CHLORIDE 0.45 % IV SOLN
Freq: Once | INTRAVENOUS | Status: AC
  Administered 2011-05-03: 09:00:00 via INTRAVENOUS

## 2011-05-03 ENCOUNTER — Other Ambulatory Visit: Payer: Self-pay | Admitting: Internal Medicine

## 2011-05-03 ENCOUNTER — Encounter (HOSPITAL_COMMUNITY): Payer: Self-pay | Admitting: *Deleted

## 2011-05-03 ENCOUNTER — Encounter (HOSPITAL_COMMUNITY)
Admission: RE | Disposition: A | Discharge status: Home / Self Care | Payer: Self-pay | Source: Ambulatory Visit | Attending: Internal Medicine

## 2011-05-03 ENCOUNTER — Ambulatory Visit (HOSPITAL_COMMUNITY)
Admission: RE | Admit: 2011-05-03 | Discharge: 2011-05-03 | Disposition: A | Discharge status: Home / Self Care | Payer: BC Managed Care – PPO | Source: Ambulatory Visit | Attending: Internal Medicine | Admitting: Internal Medicine

## 2011-05-03 DIAGNOSIS — I10 Essential (primary) hypertension: Secondary | ICD-10-CM | POA: Insufficient documentation

## 2011-05-03 DIAGNOSIS — D126 Benign neoplasm of colon, unspecified: Secondary | ICD-10-CM | POA: Insufficient documentation

## 2011-05-03 DIAGNOSIS — Z79899 Other long term (current) drug therapy: Secondary | ICD-10-CM | POA: Insufficient documentation

## 2011-05-03 DIAGNOSIS — K648 Other hemorrhoids: Secondary | ICD-10-CM

## 2011-05-03 DIAGNOSIS — Z7982 Long term (current) use of aspirin: Secondary | ICD-10-CM | POA: Insufficient documentation

## 2011-05-03 DIAGNOSIS — Z1211 Encounter for screening for malignant neoplasm of colon: Secondary | ICD-10-CM | POA: Insufficient documentation

## 2011-05-03 HISTORY — PX: COLONOSCOPY: SHX5424

## 2011-05-03 SURGERY — COLONOSCOPY
Anesthesia: Moderate Sedation

## 2011-05-03 MED ORDER — MEPERIDINE HCL 100 MG/ML IJ SOLN
INTRAMUSCULAR | Status: AC
  Filled 2011-05-03: qty 2

## 2011-05-03 MED ORDER — MIDAZOLAM HCL 5 MG/5ML IJ SOLN
INTRAMUSCULAR | Status: AC
  Filled 2011-05-03: qty 10

## 2011-05-03 MED ORDER — MIDAZOLAM HCL 5 MG/5ML IJ SOLN
INTRAMUSCULAR | Status: DC | PRN
  Administered 2011-05-03: 2 mg via INTRAVENOUS
  Administered 2011-05-03 (×2): 1 mg via INTRAVENOUS

## 2011-05-03 MED ORDER — MEPERIDINE HCL 100 MG/ML IJ SOLN
INTRAMUSCULAR | Status: DC | PRN
  Administered 2011-05-03: 25 mg via INTRAVENOUS
  Administered 2011-05-03: 50 mg via INTRAVENOUS

## 2011-05-03 NOTE — H&P (Signed)
Tana Coast, PA  04/19/2011  1:15 PM  Signed Primary Care Physician:  Josue Hector, MD   Primary Gastroenterologist:  Roetta Sessions, MD      Chief Complaint   Patient presents with   .  Colonoscopy      HPI:  Ricky [name] is a 55 y.o. male here to schedule colonoscopy. No prior colonoscopy. No FH of CRC. Sister died with breast cancer at age 31. Denies constipation, diarrhea, melena, brbpr, abd pain, n/v, heartburn, dysphagia, weight loss.     Current Outpatient Prescriptions   Medication  Sig  Dispense  Refill   .  aspirin 81 MG tablet  Take 81 mg by mouth daily.           .  clopidogrel (PLAVIX) 75 MG tablet  Take 75 mg by mouth daily.           .  colesevelam (WELCHOL) 625 MG tablet  Take 1,875 mg by mouth. 3 po bid          .  ezetimibe (ZETIA) 10 MG tablet  Take 10 mg by mouth daily.           .  metoprolol (LOPRESSOR) 50 MG tablet  Take 50 mg by mouth 2 (two) times daily.           .  Pitavastatin Calcium (LIVALO) 2 MG TABS  Take by mouth. 1 po daily          .  valsartan-hydrochlorothiazide (DIOVAN-HCT) 320-25 MG per tablet  Take 1 tablet by mouth daily.               Allergies as of 04/19/2011   .  (No Known Allergies)       Past Medical History   Diagnosis  Date   .  CAD (coronary artery disease)         Normal coronaries (2006)   .  Dyslipidemia     .  Hypertension     .  MI (myocardial infarction)  2007       Past Surgical History   Procedure  Date   .  Varicose vein surgery     .  Inguinal hernia repair  2007       left   .  Inguinal hernia repair  2002       right       Family History   Problem  Relation  Age of Onset   .  Breast cancer  Sister  24       deceased   .  Colon cancer  Neg Hx     .  Liver disease  Neg Hx         History       Social History   .  Marital Status:  Single       Spouse Name:  N/A       Number of Children:  N/A   .  Years of Education:  N/A       Occupational History   .  Not on file.         Social History Main Topics   .  Smoking status:  Former Smoker       Quit date:  08/22/1994   .  Smokeless tobacco:  Not on file   .  Alcohol Use:  No   .  Drug Use:  No   .  Sexually Active:  Not on file  Other Topics  Concern   .  Not on file       Social History Narrative   .  No narrative on file        ROS:   General: Negative for anorexia, weight loss, fever, chills, fatigue, weakness. Eyes: Negative for vision changes.   ENT: Negative for hoarseness, difficulty swallowing , nasal congestion. CV: Negative for chest pain, angina, palpitations, dyspnea on exertion, peripheral edema.   Respiratory: Negative for dyspnea at rest, dyspnea on exertion, cough, sputum, wheezing.   GI: See history of present illness. GU:  Negative for dysuria, hematuria, urinary incontinence, urinary frequency, nocturnal urination.   MS: Negative for joint pain, low back pain.   Derm: Negative for rash or itching.   Neuro: Negative for weakness, abnormal sensation, seizure, frequent headaches, memory loss, confusion.   Psych: Negative for anxiety, depression, suicidal ideation, hallucinations.   Endo: Negative for unusual weight change.   Heme: Negative for bruising or bleeding. Allergy: Negative for rash or hives.     Physical Examination:   BP 122/70  Pulse 56  Temp(Src) 97.8 F (36.6 C) (Temporal)  Ht 5\' 7"  (1.702 m)  Wt 174 lb 12.8 oz (79.289 kg)  BMI 27.38 kg/m2    General: Well-nourished, well-developed in no acute distress.   Head: Normocephalic, atraumatic.    Eyes: Conjunctiva pink, no icterus. Mouth: Oropharyngeal mucosa moist and pink , no lesions erythema or exudate. Neck: Supple without thyromegaly, masses, or lymphadenopathy.   Lungs: Clear to auscultation bilaterally.   Heart: Regular rate and rhythm, no murmurs rubs or gallops.   Abdomen: Bowel sounds are normal, nontender, nondistended, no hepatosplenomegaly or masses, no abdominal bruits or    hernia , no  rebound or guarding.    Rectal: Defer to time of colonoscopy. Extremities: No lower extremity edema. No clubbing or deformities.   Neuro: Alert and oriented x 4 , grossly normal neurologically.   Skin: Warm and dry, no rash or jaundice.    Psych: Alert and cooperative, normal mood and affect.       Glendora Score  04/20/2011  1:03 PM  Signed Cc to PCP        Colon cancer screening - Tana Coast, [place]  04/19/2011  1:14 PM  Signed Never had a colonoscopy. Has to have one to keep from having increase in insurance premium, per patient. Colonoscopy in near future.  I have discussed the risks, alternatives, benefits with regards to but not limited to the risk of reaction to medication, bleeding, infection, perforation and the patient is agreeable to proceed. Written consent to be obtained.      I have seen the patient prior to the procedure(s) today and reviewed the history and physical / consultation from 04/19/11.  There have been no changes. After consideration of the risks, benefits, alternatives and imponderables, the patient has consented to the procedure(s).

## 2011-05-08 ENCOUNTER — Encounter: Payer: Self-pay | Admitting: Internal Medicine

## 2011-05-10 ENCOUNTER — Ambulatory Visit: Payer: BC Managed Care – PPO | Admitting: Urgent Care

## 2011-05-10 ENCOUNTER — Encounter (HOSPITAL_COMMUNITY)
Admission: RE | Disposition: A | Discharge status: Home / Self Care | Payer: Self-pay | Source: Ambulatory Visit | Attending: Internal Medicine

## 2011-05-10 ENCOUNTER — Encounter (HOSPITAL_COMMUNITY): Payer: Self-pay | Admitting: *Deleted

## 2011-05-10 ENCOUNTER — Telehealth: Payer: Self-pay

## 2011-05-10 ENCOUNTER — Other Ambulatory Visit: Payer: Self-pay | Admitting: Internal Medicine

## 2011-05-10 ENCOUNTER — Observation Stay (HOSPITAL_COMMUNITY)
Admission: RE | Admit: 2011-05-10 | Discharge: 2011-05-11 | Disposition: A | Discharge status: Home / Self Care | Payer: BC Managed Care – PPO | Source: Ambulatory Visit | Attending: Internal Medicine | Admitting: Internal Medicine

## 2011-05-10 DIAGNOSIS — K922 Gastrointestinal hemorrhage, unspecified: Secondary | ICD-10-CM

## 2011-05-10 DIAGNOSIS — Z7982 Long term (current) use of aspirin: Secondary | ICD-10-CM | POA: Insufficient documentation

## 2011-05-10 DIAGNOSIS — Z860100 Personal history of colon polyps, unspecified: Secondary | ICD-10-CM | POA: Insufficient documentation

## 2011-05-10 DIAGNOSIS — IMO0002 Reserved for concepts with insufficient information to code with codable children: Principal | ICD-10-CM | POA: Insufficient documentation

## 2011-05-10 DIAGNOSIS — Y838 Other surgical procedures as the cause of abnormal reaction of the patient, or of later complication, without mention of misadventure at the time of the procedure: Secondary | ICD-10-CM | POA: Insufficient documentation

## 2011-05-10 HISTORY — PX: COLONOSCOPY: SHX5424

## 2011-05-10 LAB — CBC
HCT: 34.9 % — ABNORMAL LOW (ref 39.0–52.0)
MCH: 31 pg (ref 26.0–34.0)
MCV: 87.9 fL (ref 78.0–100.0)
RDW: 12.9 % (ref 11.5–15.5)
WBC: 7.5 10*3/uL (ref 4.0–10.5)

## 2011-05-10 LAB — BASIC METABOLIC PANEL
BUN: 16 mg/dL (ref 6–23)
CO2: 28 mEq/L (ref 19–32)
Chloride: 98 mEq/L (ref 96–112)
Creatinine, Ser: 0.5 mg/dL (ref 0.50–1.35)
Glucose, Bld: 146 mg/dL — ABNORMAL HIGH (ref 70–99)

## 2011-05-10 LAB — PREPARE RBC (CROSSMATCH)

## 2011-05-10 LAB — HEMOGLOBIN AND HEMATOCRIT, BLOOD: Hemoglobin: 12.8 g/dL — ABNORMAL LOW (ref 13.0–17.0)

## 2011-05-10 SURGERY — COLONOSCOPY
Anesthesia: Moderate Sedation

## 2011-05-10 MED ORDER — STERILE WATER FOR IRRIGATION IR SOLN
Status: DC | PRN
  Administered 2011-05-10: 15:00:00

## 2011-05-10 MED ORDER — PEG 3350-KCL-NA BICARB-NACL 420 G PO SOLR
4000.0000 mL | Freq: Once | ORAL | Status: AC
  Administered 2011-05-10: 4000 mL via ORAL
  Filled 2011-05-10: qty 4000

## 2011-05-10 MED ORDER — MIDAZOLAM HCL 5 MG/5ML IJ SOLN
INTRAMUSCULAR | Status: AC
  Filled 2011-05-10: qty 10

## 2011-05-10 MED ORDER — MEPERIDINE HCL 100 MG/ML IJ SOLN
INTRAMUSCULAR | Status: DC | PRN
  Administered 2011-05-10: 25 mg via INTRAVENOUS

## 2011-05-10 MED ORDER — MEPERIDINE HCL 100 MG/ML IJ SOLN
INTRAMUSCULAR | Status: AC
  Filled 2011-05-10: qty 2

## 2011-05-10 MED ORDER — SODIUM CHLORIDE 0.45 % IV SOLN: Freq: Once | INTRAVENOUS | Status: DC

## 2011-05-10 MED ORDER — MIDAZOLAM HCL 5 MG/5ML IJ SOLN
INTRAMUSCULAR | Status: DC | PRN
  Administered 2011-05-10 (×2): 1 mg via INTRAVENOUS

## 2011-05-10 NOTE — Telephone Encounter (Signed)
Late entry:  Pt arrived at office around 1 pm.  Noted he could not wait until OV at 2:30 to be seen as he had 4 large bloody stools all over the commode & felt weak.  He appeared pale & diaphoretic.  I obtained a BP to determine if he was stable to transport to hospital by private vehicle.  BP 113/70. Pulse 90.  His sister was asked to drive him immediately to [facility].  Dr Jena Gauss was paged & advised pt to come to endo immediately.  Triage RN at [facility] advised to send pt directly to [facility] in endo.

## 2011-05-10 NOTE — Telephone Encounter (Signed)
Please nic tcs 5 years.

## 2011-05-10 NOTE — Telephone Encounter (Signed)
Has been addressed; see TCS note; needs repeat tcs in 5 years

## 2011-05-10 NOTE — H&P (Signed)
Primary Care Physician:  Josue Hector, MD Primary Gastroenterologist:  Dr.   Pre-Procedure History & Physical: HPI:  Ricky [name] is a 55 y.o. male here for emergent colonoscopy secondary to hematochezia. Patient underwent colonoscopy with snare polypectomy of cecal polyp one week ago. Ankle biopsy of a left colon polyp. He is on aspirin but not Plavix or Coumadin(in contradistinction to what is documented in the H&P; wife and patient states he's not been on Plavix for a few years now). We did have some technical difficulties removing the cecal polyp  last week (see procedure report). He's had multiple episodes of gross blood per rectum and lightheadedness. Vital signs here are stable. His hemoglobin today is 12.3 urgent colonoscopy now being done. With intent of therapeutic intervention.  Past Medical History  Diagnosis Date  . CAD (coronary artery disease)     Normal coronaries (2006)  . Dyslipidemia   . Hypertension   . MI (myocardial infarction) 2007    Past Surgical History  Procedure Date  . Varicose vein surgery   . Inguinal hernia repair 2007    left  . Inguinal hernia repair 2002    right  . Cardiac catheterization   . Colonoscopy 05/03/2011    tcs      Prior to Admission medications   Medication Sig Start Date End Date Taking? Authorizing Provider  aspirin 81 MG tablet Take 81 mg by mouth daily.     Yes Historical Provider, MD  clopidogrel (PLAVIX) 75 MG tablet Take 75 mg by mouth daily.     Yes Historical Provider, MD  colesevelam (WELCHOL) 625 MG tablet Take 1,875 mg by mouth. 3 po bid    Yes Historical Provider, MD  ezetimibe (ZETIA) 10 MG tablet Take 10 mg by mouth daily.     Yes Historical Provider, MD  metoprolol (LOPRESSOR) 50 MG tablet Take 50 mg by mouth 2 (two) times daily.     Yes Historical Provider, MD  Pitavastatin Calcium (LIVALO) 2 MG TABS Take by mouth. 1 po daily    Yes Historical Provider, MD  valsartan-hydrochlorothiazide (DIOVAN-HCT)  320-25 MG per tablet Take 1 tablet by mouth daily.     Yes Historical Provider, MD    Allergies as of 05/10/2011 - Review Complete 05/10/2011  Allergen Reaction Noted  . Lipitor (atorvastatin calcium)  05/03/2011    Family History  Problem Relation Age of Onset  . Breast cancer Sister 28    deceased  . Colon cancer Neg Hx   . Liver disease Neg Hx     History   Social History  . Marital Status: Single    Spouse Name: N/A    Number of Children: N/A  . Years of Education: N/A   Occupational History  . Not on file.   Social History Main Topics  . Smoking status: Former Smoker -- 0 years    Quit date: 08/22/1994  . Smokeless tobacco: Not on file  . Alcohol Use: No  . Drug Use: No  . Sexually Active: Not on file   Other Topics Concern  . Not on file   Social History Narrative  . No narrative on file    Review of Systems: See HPI, otherwise negative ROS  Physical Exam: BP 143/86  Pulse 64  Temp(Src) 97.9 F (36.6 C) (Oral)  Resp 20  Ht 5\' 7"  (1.702 m)  Wt 170 lb (77.111 kg)  BMI 26.63 kg/m2  SpO2 97% General:   Alert,  Well-developed, well-nourished, pleasant and cooperative  in NAD Head:  Normocephalic and atraumatic. Eyes:  Sclera clear, no icterus.   Conjunctiva pink. Ears:  Normal auditory acuity. Nose:  No deformity, discharge,  or lesions. Mouth:  No deformity or lesions, dentition normal. Neck:  Supple; no masses or thyromegaly. Lungs:  Clear throughout to auscultation.   No wheezes, crackles, or rhonchi. No acute distress. Heart:  Regular rate and rhythm; no murmurs, clicks, rubs,  or gallops. Abdomen:  Soft, nontender and nondistended. No masses, hepatosplenomegaly or hernias noted. Normal bowel sounds, without guarding, and without rebound.   Msk:  Symmetrical without gross deformities. Normal posture. Pulses:  Normal pulses noted. Extremities:  Without clubbing or edema. Neurologic:  Alert and  oriented x4;  grossly normal neurologically. Skin:   Intact without significant lesions or rashes. Cervical Nodes:  No significant cervical adenopathy. Psych:  Alert and cooperative. Normal mood and affect.  Impression/Plan:  Most likely patient has presented with a post polypectomy bleed. The cecal lesion is highly suspect. He remains hemodynamically stable but clinically, has lost a fair amount of blood.  Plan for urgent therapy colonoscopy nail appeared quite bright. Risks, benefits limitations alternatives and imponderables were reviewed with patient patient's wife. Further recommendations to follow the very near future.

## 2011-05-10 NOTE — Telephone Encounter (Signed)
Pt called this am. When he got to work this morning his stomach started hurting. He went to have a BM and it was nothing but blood. No fever. No vomiting. Spoke with KJ and LSL, paged RMR. Pt to start clear liquid diet and have stat H/H and OV with extender today. Handwritten lab order done and faxed to lab. Pt aware, and coming to OV with KJ today at 2:30.

## 2011-05-10 NOTE — Progress Notes (Signed)
Pt had one episode of large amount of bright, red stool bowel movement (approx.600cc) Pt reported nausea & dizziness; pt diaphoretic & pale. Pt placed on stretcher; vitals WNL. Dr. Jena Gauss to be notified.

## 2011-05-11 ENCOUNTER — Encounter (HOSPITAL_COMMUNITY): Payer: Self-pay | Admitting: Internal Medicine

## 2011-05-11 ENCOUNTER — Encounter: Payer: Self-pay | Admitting: Internal Medicine

## 2011-05-11 DIAGNOSIS — IMO0002 Reserved for concepts with insufficient information to code with codable children: Secondary | ICD-10-CM

## 2011-05-11 DIAGNOSIS — Z86010 Personal history of colonic polyps: Secondary | ICD-10-CM

## 2011-05-11 NOTE — Progress Notes (Signed)
Writer discussed discharge medications and instructions to pt, verbalized understanding.  Encouraged to call with any questions that may arise.  Pt wheel chaired out in stable condition and in no distress via staff member.  Pt took all belongings.

## 2011-05-11 NOTE — Discharge Summary (Signed)
Physician Discharge Summary  Patient ID: Ricky [name] MRN: [id number] DOB/AGE: [birth year] 55 y.o.  Admit date: 05/10/2011 Discharge date: 05/11/2011  Admission Diagnoses: Rectal bleeding  Discharge Diagnoses: Post-Polypectomy Bleed, s/p colonoscopy with hemostasis obtained with subsequent clips.    Discharged Condition: Stable, Good  Hospital Course:  Ricky [name] is a 55 y.o. male who underwent a colonoscopy on 05/03/2011 with removal of cecal polyp who presented 9/18 with multiple episodes of gross blood per rectum and lightheadedness. His vital signs were stable. Hemoglobin was 12.3 at time of urgent colonoscopy. Colonoscopy performed showed bleeding at site of prior polypectomy. Hemostasis was obtained excellently. He had been taking Plavix and Aspirin; discussion with the cardiologist completed and patient will stop Plavix indefinitely. He will also resume 81 mg Aspirin in 1 week. On 9/19, he denied any abdominal pain, nausea, or vomiting. He had only one small amount of dark, old blood at midnight. Otherwise, he has had no further evidence of bleeding. Hemoglobin 11.1 this morning. He is asymptomatic and desires to go home. We will see him outpatient in follow-up in the next 2-4 weeks. He will have a surveillance colonoscopy in 5 years.   Consults: N/A  Significant Diagnostic Studies: N/A  Treatments: Colonoscopy for evaluation of rectal bleeding, with subsequent hemostasis obtained  Discharge Exam: Blood pressure 136/88, pulse 85, temperature 97.7 F (36.5 C), temperature source Oral, resp. rate 18, height 5\' 7"  (1.702 m), weight 170 lb (77.111 kg), SpO2 99.00%. Physical Examination: General appearance - alert, well appearing, and in no distress and oriented to person, place, and time Mental status - alert, oriented to person, place, and time Chest - clear to auscultation, no wheezes, rales or rhonchi, symmetric air entry Heart - S1 and S2 normal Abdomen - soft, nontender,  nondistended, no masses or organomegaly bowel sounds normal Neurological - alert, oriented, normal speech, no focal findings or movement disorder noted Musculoskeletal - no joint tenderness, deformity or swelling Extremities - no pedal edema noted Skin - normal coloration and turgor, no rashes, no suspicious skin lesions noted  Disposition: Home   Current Discharge Medication List    CONTINUE these medications which have NOT CHANGED   Details  colesevelam (WELCHOL) 625 MG tablet Take 1,875 mg by mouth. 3 po bid     ezetimibe (ZETIA) 10 MG tablet Take 10 mg by mouth daily.      metoprolol (LOPRESSOR) 50 MG tablet Take 50 mg by mouth 2 (two) times daily.      Multiple Vitamins-Minerals (MULTIVITAMINS THER. W/MINERALS) TABS Take 1 tablet by mouth daily.      Pitavastatin Calcium (LIVALO) 2 MG TABS Take by mouth. 1 po daily     valsartan-hydrochlorothiazide (DIOVAN-HCT) 320-25 MG per tablet Take 1 tablet by mouth daily.        STOP taking these medications     aspirin 81 MG tablet      clopidogrel (PLAVIX) 75 MG tablet          Signed: Gerrit Halls 05/11/2011, 11:20 AM

## 2011-05-12 NOTE — Telephone Encounter (Signed)
Reminder in epic to repeat tcs in 5 years °

## 2011-05-14 LAB — TYPE AND SCREEN
ABO/RH(D): A POS
Antibody Screen: NEGATIVE
Unit division: 0

## 2011-05-17 ENCOUNTER — Encounter (HOSPITAL_COMMUNITY): Payer: Self-pay | Admitting: Internal Medicine

## 2011-05-25 ENCOUNTER — Encounter: Payer: Self-pay | Admitting: Urgent Care

## 2011-05-25 ENCOUNTER — Ambulatory Visit (INDEPENDENT_AMBULATORY_CARE_PROVIDER_SITE_OTHER): Payer: BC Managed Care – PPO | Admitting: Urgent Care

## 2011-05-25 DIAGNOSIS — K922 Gastrointestinal hemorrhage, unspecified: Secondary | ICD-10-CM | POA: Insufficient documentation

## 2011-05-25 DIAGNOSIS — D126 Benign neoplasm of colon, unspecified: Secondary | ICD-10-CM | POA: Insufficient documentation

## 2011-05-25 NOTE — Progress Notes (Signed)
Referring Provider: Josue Hector,* Primary Care Physician:  Josue Hector, MD Primary Gastroenterologist:  Dr. Jena Gauss  Chief Complaint  Patient presents with  . Follow-up    HPI:  Ricky [name] is a 55 y.o. male here for follow up for post-polypectomy bleed as below.   He had inadvertently been taking generic plavix after this had been discontinued by his cardiologist.  He is on ASA 81mg  daily.  He has been doing well.  Denies any lower GI symptoms including constipation, diarrhea, rectal bleeding, melena or weight loss.     Past Medical History  Diagnosis Date  . CAD (coronary artery disease)     Normal coronaries (2006)  . Dyslipidemia   . Hypertension   . MI (myocardial infarction) 2007  . Serrated adenoma of colon 04/2011   Past Surgical History  Procedure Date  . Varicose vein surgery   . Inguinal hernia repair 2007    left  . Inguinal hernia repair 2002    right  . Cardiac catheterization   . Colonoscopy 05/03/2011    Dr Rourk-serrated adenomas,hemorrhoids  . Colonoscopy 05/10/2011    post-polypectomy bleed-reolution clip   Current Outpatient Prescriptions  Medication Sig Dispense Refill  . colesevelam (WELCHOL) 625 MG tablet Take 1,875 mg by mouth. 3 po bid       . ezetimibe (ZETIA) 10 MG tablet Take 10 mg by mouth daily.        . metoprolol (LOPRESSOR) 50 MG tablet Take 50 mg by mouth 2 (two) times daily.        . Multiple Vitamins-Minerals (MULTIVITAMINS THER. W/MINERALS) TABS Take 1 tablet by mouth daily.        . Pitavastatin Calcium (LIVALO) 2 MG TABS Take by mouth. 1 po daily       . valsartan-hydrochlorothiazide (DIOVAN-HCT) 320-25 MG per tablet Take 1 tablet by mouth daily.         Allergies as of 05/25/2011 - Review Complete 05/25/2011  Allergen Reaction Noted  . Lipitor (atorvastatin calcium) Other (See Comments) 05/03/2011   Review of Systems: Gen: Denies any fever, chills, sweats, anorexia, fatigue, weakness, malaise, weight loss, and  sleep disorder CV: Denies chest pain, angina, palpitations, syncope, orthopnea, PND, peripheral edema, and claudication. Resp: Denies dyspnea at rest, dyspnea with exercise, cough, sputum, wheezing, coughing up blood, and pleurisy. GI: Denies vomiting blood, jaundice, and fecal incontinence.   Denies dysphagia or odynophagia. Derm: Denies rash, itching, dry skin, hives, moles, warts, or unhealing ulcers.  Psych: Denies depression, anxiety, memory loss, suicidal ideation, hallucinations, paranoia, and confusion. Heme: Denies bruising, bleeding, and enlarged lymph nodes.  Physical Exam: BP 114/70  Pulse 67  Temp(Src) 97.6 F (36.4 C) (Temporal)  Ht 5\' 7"  (1.702 m)  Wt 171 lb 3.2 oz (77.656 kg)  BMI 26.81 kg/m2 General:   Alert,  Well-developed, well-nourished, pleasant and cooperative in NAD Head:  Normocephalic and atraumatic. Eyes:  Sclera clear, no icterus.   Conjunctiva pink. Mouth:  No deformity or lesions, OP pink/moist. Neck:  Supple; no masses or thyromegaly. Heart:  Regular rate and rhythm; no murmurs, clicks, rubs,  or gallops. Abdomen:  Soft, nontender and nondistended. No masses, hepatosplenomegaly or hernias noted. Normal bowel sounds, without guarding, and without rebound.   Msk:  Symmetrical without gross deformities. Normal posture. Pulses:  Normal pulses noted. Extremities:  Without clubbing or edema. Neurologic:  Alert and  oriented x4;  grossly normal neurologically. Skin:  Intact without significant lesions or rashes. Cervical Nodes:  No significant  cervical adenopathy. Psych:  Alert and cooperative. Normal mood and affect.

## 2011-05-25 NOTE — Progress Notes (Signed)
Cc to PCP 

## 2011-05-25 NOTE — Patient Instructions (Signed)
Next colonoscopy 04/2016 Call sooner if you have any problems

## 2011-05-25 NOTE — Assessment & Plan Note (Signed)
Next colonoscopy 04/2016 or sooner if any problems

## 2011-05-25 NOTE — Assessment & Plan Note (Signed)
Ricky [name] is a 55 y.o. male doing well s/p clip placement for post-polypectomy bleed.  Doing well.   No MRI until clip know to pass.

## 2011-07-04 ENCOUNTER — Encounter: Payer: Self-pay | Admitting: Cardiology

## 2011-07-21 NOTE — Progress Notes (Signed)
REVIEWED.  

## 2014-04-23 ENCOUNTER — Encounter: Payer: Self-pay | Admitting: Internal Medicine

## 2016-03-25 ENCOUNTER — Encounter: Payer: Self-pay | Admitting: Internal Medicine

## 2018-03-06 ENCOUNTER — Telehealth: Payer: Self-pay

## 2018-03-06 ENCOUNTER — Ambulatory Visit (INDEPENDENT_AMBULATORY_CARE_PROVIDER_SITE_OTHER): Payer: Self-pay

## 2018-03-06 DIAGNOSIS — Z86010 Personal history of colonic polyps: Secondary | ICD-10-CM

## 2018-03-06 MED ORDER — NA SULFATE-K SULFATE-MG SULF 17.5-3.13-1.6 GM/177ML PO SOLN: 1.0000 | ORAL | 0 refills | Status: DC

## 2018-03-06 NOTE — Progress Notes (Signed)
Gastroenterology Pre-Procedure Review  Request Date:03/06/18 Requesting Physician: Dr.Nyland last tcs RMR  05/03/11 sessile serrated adenoma  PATIENT REVIEW QUESTIONS: The patient responded to the following health history questions as indicated:    1. Diabetes Melitis: no 2. Joint replacements in the past 12 months: no 3. Major health problems in the past 3 months: no 4. Has an artificial valve or MVP: no 5. Has a defibrillator: no 6. Has been advised in past to take antibiotics in advance of a procedure like teeth cleaning: no 7. Family history of colon cancer: no  8. Alcohol Use: no 9. History of sleep apnea: no  10. History of coronary artery or other vascular stents placed within the last 12 months: no 11. History of any prior anesthesia complications: no    MEDICATIONS & ALLERGIES:    Patient reports the following regarding taking any blood thinners:   Plavix? no Aspirin? yes (81mg ) Coumadin? no Brilinta? no Xarelto? no Eliquis? no Pradaxa? no Savaysa? no Effient? no  Patient confirms/reports the following medications:  Current Outpatient Medications  Medication Sig Dispense Refill  . aspirin EC 81 MG tablet Take by mouth.    . colesevelam (WELCHOL) 625 MG tablet Take 1,250 mg by mouth. 2 po bid    . ezetimibe (ZETIA) 10 MG tablet Take 10 mg by mouth daily.      Marland Kitchen KRILL OIL PO Take by mouth.    . metoprolol (LOPRESSOR) 50 MG tablet Take 50 mg by mouth 2 (two) times daily.      . Multiple Vitamins-Minerals (MULTIVITAMINS THER. W/MINERALS) TABS Take 1 tablet by mouth daily.      . Pitavastatin Calcium (LIVALO) 2 MG TABS Take by mouth. 1 po daily     . valsartan-hydrochlorothiazide (DIOVAN-HCT) 320-25 MG per tablet Take 1 tablet by mouth daily.       No current facility-administered medications for this visit.     Patient confirms/reports the following allergies:  Allergies  Allergen Reactions  . Lipitor [Atorvastatin Calcium] Other (See Comments)    Joint pain     No orders of the defined types were placed in this encounter.   AUTHORIZATION INFORMATION Primary Insurance: [place] ,  [place] #: [id number] Pre-Cert / Josem Kaufmann required: no   SCHEDULE INFORMATION: Procedure has been scheduled as follows:  Date: 05/11/18, Time:10:30 Location: [facility] Dr.Rourk  This Gastroenterology Pre-Precedure Review Form is being routed to the following provider(s): AB

## 2018-03-06 NOTE — Telephone Encounter (Signed)
I believe he is on both aspirin and Plavix around the time of his last colonoscopy. No longer on Plavix. He continues on aspirin daily. Although it is now not the standard recommended for a patient to stop aspirin, in an abundance of caution, I recommend he just go ahead and stop his aspirin for 5 days prior to the procedure.

## 2018-03-06 NOTE — Telephone Encounter (Signed)
Pt came in for an nurse visit to schedule tcs. Pt had a very serious bleed after his last tcs in 2012 and had to be hospitalized. He said he was told it was due to his ASA he was taking. He is currently taking the same medication he was taking in 2012. He wants to know if he needs to stop taking anything prior to this tcs?  He also has had an ifobt since he was scheduled for his nurse visit and it was positive, he feels like it is due to hemorrhoids. He said he has had some problems with them recently.   Dr.Rourk, pt is scheduled for tcs and has instructions but I told him that it was a possibility he may have to come back for an office visit and he stated he understood. Do you want him to come back for an office visit? Or do you want to stop any of his medications?    He is currently taking the following medications:   aspirin (ASPRI-LOW,ECOTRIN LOW DOSE) 81 mg EC tablet Take one tablet (81 mg total) by mouth daily. 150 tablet 2  . colesevelam (Welchol) 625mg  take 2 twice a day . ezetimibe (ZETIA) 10 MG tablet Take 1 tablet (10 mg total) by mouth daily. 90 tablet 4  . metoprolol tartrate (LOPRESSOR) 50 mg tablet Take one tablet (50 mg total) by mouth 2 (two) times daily. 180 tablet 5  . multivitamin with minerals (MULTIVITAMIN/MINERAL) tablet Take by mouth.  . Omega-3 Fatty Acids (FISH OIL OMEGA-3 PO) Take by mouth daily. Doesn't know dosage: takes 3 capsules by mouth daily  . pitavastatin calcium (LIVALO) 4 mg TABS tablet Take one tablet (4 mg total) by mouth 1 (one) hour before bedtime. 90 tablet 4  . valsartan-hydrochlorothiazide (DIOVAN-HCT) 320-12.5 mg per tablet TAKE 1 TABLET DAILY 90 tablet 2

## 2018-03-06 NOTE — Patient Instructions (Signed)
Ricky [name]  [birth year] MRN: [id number]     Procedure Date: 05/11/18 Time to register: 9:30am Place to register: [facility] Procedure Time: 10:30am Scheduled provider: Dr.Rourk    PREPARATION FOR COLONOSCOPY WITH SUPREP BOWEL PREP KIT  Note: Suprep Bowel Prep Kit is a split-dose (2day) regimen. Consumption of BOTH 6-ounce bottles is required for a complete prep.  Please notify us immediately if you are diabetic, take iron supplements, or if you are on Coumadin or any other blood thinners.                                                                                                                                                    2 DAYS BEFORE PROCEDURE:  DATE: 05/09/18  DAY: Wednesday Begin clear liquid diet AFTER your lunch meal. NO SOLID FOODS after this point.  1 DAY BEFORE PROCEDURE:  DATE: 05/10/18   DAY: Thursday Continue clear liquids the entire day - NO SOLID FOOD.     At 6:00pm: Complete steps 1 through 4 below, using ONE (1) 6-ounce bottle, before going to bed. Step 1:  Pour ONE (1) 6-ounce bottle of SUPREP liquid into the mixing container.  Step 2:  Add cool drinking water to the 16 ounce line on the container and mix.  Note: Dilute the solution concentrate as directed prior to use. Step 3:  DRINK ALL the liquid in the container. Step 4:  You MUST drink an additional two (2) or more 16 ounce containers of water over the next one (1) hour.   Continue clear liquids.  DAY OF PROCEDURE:   DATE: 05/11/18   DAY: Friday If you take medications for your heart, blood pressure, or breathing, you may take these medications.    5 hours before your procedure at 5:30am: Step 1:  Pour ONE (1) 6-ounce bottle of SUPREP liquid into the mixing container.  Step 2:  Add cool drinking water to the 16 ounce line on the container and mix.  Note: Dilute the solution concentrate as directed prior to use. Step 3:  DRINK ALL the liquid in the container. Step 4:  You MUST  drink an additional two (2) or more 16 ounce containers of water over the next one (1) hour. You MUST complete the final glass of water at least 3 hours before your colonoscopy.    Nothing by mouth past: 7:30am  You may take your morning medications with sip of water unless we have instructed otherwise.    Please see below for Dietary Information.  CLEAR LIQUIDS INCLUDE:  Water Jello (NOT red in color)   Ice Popsicles (NOT red in color)   Tea (sugar ok, no milk/cream) Powdered fruit flavored drinks  Coffee (sugar ok, no milk/cream) Gatorade/ Lemonade/ Kool-Aid  (NOT red in color)   Juice: apple, white grape, white cranberry Soft  drinks  Clear bullion, consomme, broth (fat free beef/chicken/vegetable)  Carbonated beverages (any kind)  Strained chicken noodle soup Hard Candy   Remember: Clear liquids are liquids that will allow you to see your fingers on the other side of a clear glass. Be sure liquids are NOT red in color, and not cloudy, but CLEAR.  DO NOT EAT OR DRINK ANY OF THE FOLLOWING:  Dairy products of any kind   Cranberry juice Tomato juice / V8 juice   Grapefruit juice Orange juice     Red grape juice  Do not eat any solid foods, including such foods as: cereal, oatmeal, yogurt, fruits, vegetables, creamed soups, eggs, bread, crackers, pureed foods in a blender, etc.   HELPFUL HINTS FOR DRINKING PREP SOLUTION:   Make sure prep is extremely cold. Mix and refrigerate the the morning of the prep. You may also put in the freezer.   You may try mixing some Crystal Light or Country Time Lemonade if you prefer. Mix in small amounts; add more if necessary.  Try drinking through a straw  Rinse mouth with water or a mouthwash between glasses, to remove after-taste.  Try sipping on a cold beverage /ice/ popsicles between glasses of prep.  Place a piece of sugar-free hard candy in mouth between glasses.  If you become nauseated, try consuming smaller amounts, or stretch out  the time between glasses. Stop for 30-60 minutes, then slowly start back drinking.     OTHER INSTRUCTIONS  You will need a responsible adult at least 62 years of age to accompany you and drive you home. This person must remain in the waiting room during your procedure. The hospital will cancel your procedure if you do not have a responsible adult with you.   1. Wear loose fitting clothing that is easily removed. 2. Leave jewelry and other valuables at home.  3. Remove all body piercing jewelry and leave at home. 4. Total time from sign-in until discharge is approximately 2-3 hours. 5. You should go home directly after your procedure and rest. You can resume normal activities the day after your procedure. 6. The day of your procedure you should not:  Drive  Make legal decisions  Operate machinery  Drink alcohol  Return to work   You may call the office (Dept: [phone]) before 5:00pm, or page the doctor on call [phone]) after 5:00pm, for further instructions, if necessary.   Insurance Information YOU WILL NEED TO CHECK WITH YOUR INSURANCE COMPANY FOR THE BENEFITS OF COVERAGE YOU HAVE FOR THIS PROCEDURE.  UNFORTUNATELY, NOT ALL INSURANCE COMPANIES HAVE BENEFITS TO COVER ALL OR PART OF THESE TYPES OF PROCEDURES.  IT IS YOUR RESPONSIBILITY TO CHECK YOUR BENEFITS, HOWEVER, WE WILL BE GLAD TO ASSIST YOU WITH ANY CODES YOUR INSURANCE COMPANY MAY NEED.    PLEASE NOTE THAT MOST INSURANCE COMPANIES WILL NOT COVER A SCREENING COLONOSCOPY FOR PEOPLE UNDER THE AGE OF 50  IF YOU HAVE BCBS INSURANCE, YOU MAY HAVE BENEFITS FOR A SCREENING COLONOSCOPY BUT IF POLYPS ARE FOUND THE DIAGNOSIS WILL CHANGE AND THEN YOU MAY HAVE A DEDUCTIBLE THAT WILL NEED TO BE MET. SO PLEASE MAKE SURE YOU CHECK YOUR BENEFITS FOR A SCREENING COLONOSCOPY AS WELL AS A DIAGNOSTIC COLONOSCOPY.

## 2018-03-07 NOTE — Telephone Encounter (Signed)
Called and spoke with the pts sister Juliann Pulse- per his request, and informed her to have him hold his ASA for 5 days prior to the procedure. Letter also mailed to the pt.

## 2018-03-09 NOTE — Progress Notes (Signed)
Appropriate.

## 2018-05-11 ENCOUNTER — Other Ambulatory Visit: Payer: Self-pay

## 2018-05-11 ENCOUNTER — Encounter (HOSPITAL_COMMUNITY): Payer: Self-pay | Admitting: *Deleted

## 2018-05-11 ENCOUNTER — Encounter (HOSPITAL_COMMUNITY)
Admission: RE | Disposition: A | Discharge status: Home / Self Care | Payer: Self-pay | Source: Ambulatory Visit | Attending: Internal Medicine

## 2018-05-11 ENCOUNTER — Ambulatory Visit (HOSPITAL_COMMUNITY)
Admission: RE | Admit: 2018-05-11 | Discharge: 2018-05-11 | Disposition: A | Discharge status: Home / Self Care | Payer: BLUE CROSS/BLUE SHIELD | Source: Ambulatory Visit | Attending: Internal Medicine | Admitting: Internal Medicine

## 2018-05-11 DIAGNOSIS — Z79899 Other long term (current) drug therapy: Secondary | ICD-10-CM | POA: Diagnosis not present

## 2018-05-11 DIAGNOSIS — I10 Essential (primary) hypertension: Secondary | ICD-10-CM | POA: Insufficient documentation

## 2018-05-11 DIAGNOSIS — I2510 Atherosclerotic heart disease of native coronary artery without angina pectoris: Secondary | ICD-10-CM | POA: Insufficient documentation

## 2018-05-11 DIAGNOSIS — Z7982 Long term (current) use of aspirin: Secondary | ICD-10-CM | POA: Insufficient documentation

## 2018-05-11 DIAGNOSIS — Z87891 Personal history of nicotine dependence: Secondary | ICD-10-CM | POA: Insufficient documentation

## 2018-05-11 DIAGNOSIS — Z1211 Encounter for screening for malignant neoplasm of colon: Secondary | ICD-10-CM | POA: Insufficient documentation

## 2018-05-11 DIAGNOSIS — Z86010 Personal history of colonic polyps: Secondary | ICD-10-CM | POA: Diagnosis not present

## 2018-05-11 DIAGNOSIS — E785 Hyperlipidemia, unspecified: Secondary | ICD-10-CM | POA: Insufficient documentation

## 2018-05-11 DIAGNOSIS — D120 Benign neoplasm of cecum: Secondary | ICD-10-CM

## 2018-05-11 DIAGNOSIS — I252 Old myocardial infarction: Secondary | ICD-10-CM | POA: Insufficient documentation

## 2018-05-11 HISTORY — PX: COLONOSCOPY: SHX5424

## 2018-05-11 HISTORY — PX: POLYPECTOMY: SHX5525

## 2018-05-11 SURGERY — COLONOSCOPY
Anesthesia: Moderate Sedation

## 2018-05-11 MED ORDER — MEPERIDINE HCL 50 MG/ML IJ SOLN
INTRAMUSCULAR | Status: AC
  Filled 2018-05-11: qty 1

## 2018-05-11 MED ORDER — MIDAZOLAM HCL 5 MG/5ML IJ SOLN
INTRAMUSCULAR | Status: AC
  Filled 2018-05-11: qty 5

## 2018-05-11 MED ORDER — ONDANSETRON HCL 4 MG/2ML IJ SOLN
INTRAMUSCULAR | Status: DC | PRN
  Administered 2018-05-11: 4 mg via INTRAVENOUS

## 2018-05-11 MED ORDER — MIDAZOLAM HCL 5 MG/5ML IJ SOLN
INTRAMUSCULAR | Status: DC | PRN
  Administered 2018-05-11: 1 mg via INTRAVENOUS
  Administered 2018-05-11: 2 mg via INTRAVENOUS

## 2018-05-11 MED ORDER — ONDANSETRON HCL 4 MG/2ML IJ SOLN
INTRAMUSCULAR | Status: AC
  Filled 2018-05-11: qty 2

## 2018-05-11 MED ORDER — SODIUM CHLORIDE 0.9 % IV SOLN
INTRAVENOUS | Status: DC
  Administered 2018-05-11: 09:00:00 via INTRAVENOUS

## 2018-05-11 MED ORDER — MEPERIDINE HCL 100 MG/ML IJ SOLN
INTRAMUSCULAR | Status: DC | PRN
  Administered 2018-05-11: 25 mg

## 2018-05-11 NOTE — Discharge Instructions (Signed)
Colonoscopy Discharge Instructions  Read the instructions outlined below and refer to this sheet in the next few weeks. These discharge instructions provide you with general information on caring for yourself after you leave the hospital. Your doctor may also give you specific instructions. While your treatment has been planned according to the most current medical practices available, unavoidable complications occasionally occur. If you have any problems or questions after discharge, call Dr. Gala Romney at [phone].  After hours and weekends call the hospital and have the GI doctor on call paged; they will call you back. ACTIVITY  You may resume your regular activity, but move at a slower pace for the next 24 hours.   Take frequent rest periods for the next 24 hours.   Walking will help get rid of the air and reduce the bloated feeling in your belly (abdomen).   No driving for 24 hours (because of the medicine (anesthesia) used during the test).    Do not sign any important legal documents or operate any machinery for 24 hours (because of the anesthesia used during the test).  NUTRITION  Drink plenty of fluids.   You may resume your normal diet as instructed by your doctor.   Begin with a light meal and progress to your normal diet. Heavy or fried foods are harder to digest and may make you feel sick to your stomach (nauseated).   Avoid alcoholic beverages for 24 hours or as instructed.  MEDICATIONS  You may resume your normal medications unless your doctor tells you otherwise.  WHAT YOU CAN EXPECT TODAY  Some feelings of bloating in the abdomen.   Passage of more gas than usual.   Spotting of blood in your stool or on the toilet paper.  IF YOU HAD POLYPS REMOVED DURING THE COLONOSCOPY:  No aspirin products for 7 days or as instructed.   No alcohol for 7 days or as instructed.   Eat a soft diet for the next 24 hours.  FINDING OUT THE RESULTS OF YOUR TEST Not all test  results are available during your visit. If your test results are not back during the visit, make an appointment with your caregiver to find out the results. Do not assume everything is normal if you have not heard from your caregiver or the medical facility. It is important for you to follow up on all of your test results.   SEEK IMMEDIATE MEDICAL ATTENTION IF:  You have more than a spotting of blood in your stool.   Your belly is swollen (abdominal distention).   You are nauseated or vomiting.   You have a temperature over 101.   You have abdominal pain or discomfort that is severe or gets worse throughout the day.    Polyp information provided  Further recommendations to follow pending review of pathology report   Colon Polyps  Polyps are tissue growths inside the body. Polyps can grow in many places, including the large intestine (colon). A polyp may be a round bump or a mushroom-shaped growth. You could have one polyp or several. Most colon polyps are noncancerous (benign). However, some colon polyps can become cancerous over time. What are the causes? The exact cause of colon polyps is not known. What increases the risk? This condition is more likely to develop in people who:  Have a family history of colon cancer or colon polyps.  Are older than 56 or older than 45 if they are African American.  Have inflammatory bowel disease, such  as ulcerative colitis or Crohn disease.  Are overweight.  Smoke cigarettes.  Do not get enough exercise.  Drink too much alcohol.  Eat a diet that is: ? High in fat and red meat. ? Low in fiber.  Had childhood cancer that was treated with abdominal radiation.  What are the signs or symptoms? Most polyps do not cause symptoms. If you have symptoms, they may include:  Blood coming from your rectum when having a bowel movement.  Blood in your stool.The stool may look dark red or black.  A change in bowel habits, such as  constipation or diarrhea.  How is this diagnosed? This condition is diagnosed with a colonoscopy. This is a procedure that uses a lighted, flexible scope to look at the inside of your colon. How is this treated? Treatment for this condition involves removing any polyps that are found. Those polyps will then be tested for cancer. If cancer is found, your health care provider will talk to you about options for colon cancer treatment. Follow these instructions at home: Diet  Eat plenty of fiber, such as fruits, vegetables, and whole grains.  Eat foods that are high in calcium and vitamin D, such as milk, cheese, yogurt, eggs, liver, fish, and broccoli.  Limit foods high in fat, red meats, and processed meats, such as hot dogs, sausage, bacon, and lunch meats.  Maintain a healthy weight, or lose weight if recommended by your health care provider. General instructions  Do not smoke cigarettes.  Do not drink alcohol excessively.  Keep all follow-up visits as told by your health care provider. This is important. This includes keeping regularly scheduled colonoscopies. Talk to your health care provider about when you need a colonoscopy.  Exercise every day or as told by your health care provider. Contact a health care provider if:  You have new or worsening bleeding during a bowel movement.  You have new or increased blood in your stool.  You have a change in bowel habits.  You unexpectedly lose weight. This information is not intended to replace advice given to you by your health care provider. Make sure you discuss any questions you have with your health care provider. Document Released: 05/04/2004 Document Revised: 01/14/2016 Document Reviewed: 06/29/2015 Elsevier Interactive Patient Education  [employer].

## 2018-05-11 NOTE — H&P (Signed)
@LOGO@   Primary Care Physician:  Nyland, Leonard, MD Primary Gastroenterologist:  Dr. Rourk  Pre-Procedure History & Physical: HPI:  Ricky [name] is a 62 y.o. male here for surveillance colonoscopy. History of a serrated adenoma removed 2012. No bowel symptoms currently.  Past Medical History:  Diagnosis Date  . CAD (coronary artery disease)    Normal coronaries (2006)  . Dyslipidemia   . Hypertension   . MI (myocardial infarction) ([facility]) 2007  . Serrated adenoma of colon 04/2011    Past Surgical History:  Procedure Laterality Date  . CARDIAC CATHETERIZATION    . COLONOSCOPY  05/03/2011   Dr Rourk-serrated adenomas,hemorrhoids  . COLONOSCOPY  05/10/2011   post-polypectomy bleed-reolution clip  . INGUINAL HERNIA REPAIR  2007   left  . INGUINAL HERNIA REPAIR  2002   right  . VARICOSE VEIN SURGERY      Prior to Admission medications   Medication Sig Start Date End Date Taking? Authorizing Provider  aspirin EC 81 MG tablet Take 81 mg by mouth daily.  06/09/17 09/02/18 Yes [provider]  colesevelam (WELCHOL) 625 MG tablet Take 1,250 mg by mouth 3 (three) times daily.    Yes [provider]  ezetimibe (ZETIA) 10 MG tablet Take 10 mg by mouth daily.     Yes [provider]  MEGARED OMEGA-3 KRILL OIL 500 MG CAPS Take 500 mg by mouth daily.   Yes [provider]  metoprolol (LOPRESSOR) 50 MG tablet Take 50 mg by mouth 2 (two) times daily.     Yes [provider]  Multiple Vitamins-Minerals (MULTIVITAMINS THER. W/MINERALS) TABS Take 1 tablet by mouth daily.     Yes [provider]  Na Sulfate-K Sulfate-Mg Sulf (SUPREP BOWEL PREP KIT) 17.5-3.13-1.6 GM/177ML SOLN Take 1 kit by mouth as directed. 03/06/18  Yes Boone, Anna W, NP  Pitavastatin Calcium (LIVALO) 4 MG TABS Take 4 mg by mouth daily.    Yes [provider]  valsartan-hydrochlorothiazide (DIOVAN-HCT) 320-12.5 MG tablet Take 1 tablet by mouth daily.    Yes [provider]    Allergies as of 03/07/2018 - Review Complete 03/06/2018  Allergen Reaction Noted  . Lipitor [atorvastatin calcium] Other (See Comments) 05/03/2011    Family History  Problem Relation Age of Onset  . Breast cancer Sister 48       deceased  . Colon cancer Neg Hx   . Liver disease Neg Hx     Social History   Socioeconomic History  . Marital status: Single    Spouse name: Not on file  . Number of children: Not on file  . Years of education: Not on file  . Highest education level: Not on file  Occupational History  . Not on file  Social Needs  . Financial resource strain: Not on file  . Food insecurity:    Worry: Not on file    Inability: Not on file  . Transportation needs:    Medical: Not on file    Non-medical: Not on file  Tobacco Use  . Smoking status: Former Smoker    Years: 0.00    Last attempt to quit: 08/22/1994    Years since quitting: 23.7  Substance and Sexual Activity  . Alcohol use: No  . Drug use: No  . Sexual activity: Not on file  Lifestyle  . Physical activity:    Days per week: Not on file    Minutes per session: Not on file  . Stress: Not   on file  Relationships  . Social connections:    Talks on phone: Not on file    Gets together: Not on file    Attends religious service: Not on file    Active member of club or organization: Not on file    Attends meetings of clubs or organizations: Not on file    Relationship status: Not on file  . Intimate partner violence:    Fear of current or ex partner: Not on file    Emotionally abused: Not on file    Physically abused: Not on file    Forced sexual activity: Not on file  Other Topics Concern  . Not on file  Social History Narrative  . Not on file    Review of Systems: See HPI, otherwise negative ROS  Physical Exam: BP (!) 159/89   Temp 97.7 F (36.5 C) (Oral)   Resp 20   Ht 5' 7" (1.702 m)   Wt 74.8 kg   SpO2 99%   BMI 25.84 kg/m  General:   Alert,  Well-developed,  well-nourished, pleasant and cooperative in NAD Neck:  Supple; no masses or thyromegaly. No significant cervical adenopathy. Lungs:  Clear throughout to auscultation.   No wheezes, crackles, or rhonchi. No acute distress. Heart:  Regular rate and rhythm; no murmurs, clicks, rubs,  or gallops. Abdomen: Non-distended, normal bowel sounds.  Soft and nontender without appreciable mass or hepatosplenomegaly.  Pulses:  Normal pulses noted. Extremities:  Without clubbing or edema.  Impression/Plan:  62-year-old gentleman with history of serrated adenoma; here for surveillance colonoscopy. The risks, benefits, limitations, alternatives and imponderables have been reviewed with the patient. Questions have been answered. All parties are agreeable.      Notice: This dictation was prepared with Dragon dictation along with smaller phrase technology. Any transcriptional errors that result from this process are unintentional and may not be corrected upon review.  

## 2018-05-11 NOTE — Op Note (Signed)
[facility] Patient Name: Ricky [name] Procedure Date: 05/11/2018 9:27 AM MRN: [id number] Date of Birth: 1956-03-16 Attending MD: Norvel Richards , MD CSN: [id number] Age: 62 Admit Type: Outpatient Procedure:                Colonoscopy Indications:              High risk colon cancer surveillance: Personal                            history of colonic polyps Providers:                Norvel Richards, MD, Janeece Riggers, RN, Nelma Rothman, Technician Referring MD:              Medicines:                Midazolam 3 mg IV, Meperidine 25 mg IV Complications:            No immediate complications. Estimated Blood Loss:     Estimated blood loss was minimal. Procedure:                Pre-Anesthesia Assessment:                           - Prior to the procedure, a History and Physical                            was performed, and patient medications and                            allergies were reviewed. The patient's tolerance of                            previous anesthesia was also reviewed. The risks                            and benefits of the procedure and the sedation                            options and risks were discussed with the patient.                            All questions were answered, and informed consent                            was obtained. Prior Anticoagulants: The patient has                            taken no previous anticoagulant or antiplatelet                            agents. ASA Grade Assessment: II - A patient with  mild systemic disease. After reviewing the risks                            and benefits, the patient was deemed in                            satisfactory condition to undergo the procedure.                           After obtaining informed consent, the colonoscope                            was passed under direct vision. Throughout the                            procedure,  the patient's blood pressure, pulse, and                            oxygen saturations were monitored continuously. The                            CF-HQ190L ([id number]) scope was introduced through                            the anus and advanced to the the cecum, identified                            by appendiceal orifice and ileocecal valve. The                            colonoscopy was performed without difficulty. The                            patient tolerated the procedure well. The quality                            of the bowel preparation was adequate. The                            ileocecal valve, appendiceal orifice, and rectum                            were photographed. The quality of the bowel                            preparation was adequate. Scope In: 9:38:53 AM Scope Out: 9:53:05 AM Scope Withdrawal Time: 0 hours 10 minutes 39 seconds  Total Procedure Duration: 0 hours 14 minutes 12 seconds  Findings:      The perianal and digital rectal examinations were normal.      A 4 mm polyp was found in the cecum. The polyp was sessile. The polyp       was removed with a cold snare. Resection and retrieval were complete.       Estimated blood loss was minimal.  The exam was otherwise without abnormality on direct and retroflexion       views. Impression:               - One 4 mm polyp in the cecum, removed with a cold                            snare. Resected and retrieved.                           - The examination was otherwise normal on direct                            and retroflexion views. Moderate Sedation:      Moderate (conscious) sedation was administered by the endoscopy nurse       and supervised by the endoscopist. The following parameters were       monitored: oxygen saturation, heart rate, blood pressure, respiratory       rate, EKG, adequacy of pulmonary ventilation, and response to care.       Total physician intraservice time was 18  minutes. Recommendation:           - Patient has a contact number available for                            emergencies. The signs and symptoms of potential                            delayed complications were discussed with the                            patient. Return to normal activities tomorrow.                            Written discharge instructions were provided to the                            patient.                           - Resume previous diet.                           - Continue present medications.                           - Repeat colonoscopy date to be determined after                            pending pathology results are reviewed for                            surveillance based on pathology results.                           - Return to GI office (date not yet determined). Procedure Code(s):        --- Professional ---  45385, Colonoscopy, flexible; with removal of                            tumor(s), polyp(s), or other lesion(s) by snare                            technique                           G0500, Moderate sedation services provided by the                            same physician or other qualified health care                            professional performing a gastrointestinal                            endoscopic service that sedation supports,                            requiring the presence of an independent trained                            observer to assist in the monitoring of the                            patient's level of consciousness and physiological                            status; initial 15 minutes of intra-service time;                            patient age 90 years or older (additional time may                            be reported with [phone], as appropriate) Diagnosis Code(s):        --- Professional ---                           Z86.010, Personal history of colonic polyps                            D12.0, Benign neoplasm of cecum CPT copyright 2017 American Medical Association. All rights reserved. The codes documented in this report are preliminary and upon coder review may  be revised to meet current compliance requirements. Cristopher Estimable. Luma Clopper, MD Norvel Richards, MD 05/11/2018 10:03:04 AM This report has been signed electronically. Number of Addenda: 0

## 2018-05-15 ENCOUNTER — Encounter: Payer: Self-pay | Admitting: Internal Medicine

## 2018-05-16 ENCOUNTER — Encounter (HOSPITAL_COMMUNITY): Payer: Self-pay | Admitting: Internal Medicine

## 2020-06-08 ENCOUNTER — Other Ambulatory Visit (HOSPITAL_COMMUNITY): Payer: Self-pay | Admitting: Internal Medicine

## 2020-06-08 ENCOUNTER — Other Ambulatory Visit: Payer: Self-pay | Admitting: Internal Medicine

## 2020-06-08 DIAGNOSIS — K4090 Unilateral inguinal hernia, without obstruction or gangrene, not specified as recurrent: Secondary | ICD-10-CM

## 2020-07-01 ENCOUNTER — Ambulatory Visit (HOSPITAL_COMMUNITY)
Admission: RE | Admit: 2020-07-01 | Discharge: 2020-07-01 | Disposition: A | Discharge status: Home / Self Care | Payer: BC Managed Care – PPO | Source: Ambulatory Visit | Attending: Internal Medicine | Admitting: Internal Medicine

## 2020-07-01 ENCOUNTER — Other Ambulatory Visit: Payer: Self-pay

## 2020-07-01 DIAGNOSIS — K4090 Unilateral inguinal hernia, without obstruction or gangrene, not specified as recurrent: Secondary | ICD-10-CM | POA: Insufficient documentation

## 2020-08-28 ENCOUNTER — Other Ambulatory Visit: Payer: Self-pay | Admitting: *Deleted

## 2020-08-28 DIAGNOSIS — K743 Primary biliary cirrhosis: Secondary | ICD-10-CM

## 2020-08-28 DIAGNOSIS — I871 Compression of vein: Secondary | ICD-10-CM

## 2020-09-11 ENCOUNTER — Other Ambulatory Visit (HOSPITAL_COMMUNITY): Payer: BC Managed Care – PPO

## 2020-09-11 ENCOUNTER — Encounter: Payer: BC Managed Care – PPO | Admitting: Vascular Surgery

## 2020-10-27 ENCOUNTER — Other Ambulatory Visit: Payer: Self-pay

## 2020-10-27 DIAGNOSIS — I871 Compression of vein: Secondary | ICD-10-CM

## 2020-10-28 ENCOUNTER — Other Ambulatory Visit: Payer: Self-pay

## 2020-10-28 DIAGNOSIS — I871 Compression of vein: Secondary | ICD-10-CM

## 2020-10-30 ENCOUNTER — Encounter: Payer: Self-pay | Admitting: Vascular Surgery

## 2020-10-30 ENCOUNTER — Ambulatory Visit (HOSPITAL_COMMUNITY)
Admission: RE | Admit: 2020-10-30 | Discharge: 2020-10-30 | Disposition: A | Discharge status: Home / Self Care | Payer: BC Managed Care – PPO | Source: Ambulatory Visit | Attending: Vascular Surgery | Admitting: Vascular Surgery

## 2020-10-30 ENCOUNTER — Other Ambulatory Visit (HOSPITAL_COMMUNITY): Payer: BC Managed Care – PPO

## 2020-10-30 ENCOUNTER — Ambulatory Visit: Payer: BC Managed Care – PPO | Admitting: Vascular Surgery

## 2020-10-30 ENCOUNTER — Other Ambulatory Visit: Payer: Self-pay

## 2020-10-30 VITALS — BP 158/78 | HR 59 | Temp 98.2°F | Resp 20 | Ht 67.0 in | Wt 170.0 lb

## 2020-10-30 DIAGNOSIS — I871 Compression of vein: Secondary | ICD-10-CM

## 2020-10-30 NOTE — Progress Notes (Signed)
Patient ID: Ricky [name], male   DOB: Jun 30, 1956, 65 y.o.   MRN: [id number]  Reason for Consult: New Patient (Initial Visit)   Referred by Adaline Sill, NP  Subjective:     HPI:  Ricky [name] is a 65 y.o. male history of bilateral lower extremity swelling left greater than right.  He also has varicosities in the left greater than right lower extremities.  He states that he did have one bleeding episode in his left ankle varicosity this was 11 years ago.  He does have knee-high gentle compression that he wears at home.  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  He has had coronary angiography in the past no previous venous interventions.  He denies any history of DVT.  He does not take any blood thinners.  Past Medical History:  Diagnosis Date  . Allergy   . CAD (coronary artery disease)    Normal coronaries (2006)  . Dyslipidemia   . Hypertension   . MI (myocardial infarction) ([place]) 2007  . Serrated adenoma of colon 04/2011   Family History  Problem Relation Age of Onset  . Breast cancer Sister 93       deceased  . Colon cancer Neg Hx   . Liver disease Neg Hx    Past Surgical History:  Procedure Laterality Date  . CARDIAC CATHETERIZATION    . COLONOSCOPY  05/03/2011   Dr Rourk-serrated adenomas,hemorrhoids  . COLONOSCOPY  05/10/2011   post-polypectomy bleed-reolution clip  . COLONOSCOPY N/A 05/11/2018   Procedure: COLONOSCOPY;  Surgeon: Daneil Dolin, MD;  Location: [facility];  Service: Endoscopy;  Laterality: N/A;  10:30  . INGUINAL HERNIA REPAIR  2007   left  . INGUINAL HERNIA REPAIR  2002   right  . POLYPECTOMY  05/11/2018   Procedure: POLYPECTOMY;  Surgeon: Daneil Dolin, MD;  Location: [facility];  Service: Endoscopy;;  . VARICOSE VEIN SURGERY      Short Social History:  Social History   Tobacco Use  . Smoking status: Former Smoker    Years: 0.00    Quit date: 08/22/1994    Years since quitting: 26.2  . Smokeless tobacco: Never Used   Substance Use Topics  . Alcohol use: No    Allergies  Allergen Reactions  . Lipitor [Atorvastatin Calcium] Other (See Comments)    Joint pain    Current Outpatient Medications  Medication Sig Dispense Refill  . azelastine (OPTIVAR) 0.05 % ophthalmic solution Apply to eye.    . ezetimibe (ZETIA) 10 MG tablet Take 10 mg by mouth daily.    . furosemide (LASIX) 20 MG tablet Take 20 mg by mouth daily.    Marland Kitchen MEGARED OMEGA-3 KRILL OIL 500 MG CAPS Take 500 mg by mouth daily.    . metoprolol (LOPRESSOR) 50 MG tablet Take 50 mg by mouth 2 (two) times daily.    . Multiple Vitamins-Minerals (MULTIVITAMINS THER. W/MINERALS) TABS Take 1 tablet by mouth daily.    . Pitavastatin Calcium 4 MG TABS Take 4 mg by mouth daily.     . valsartan-hydrochlorothiazide (DIOVAN-HCT) 320-12.5 MG tablet Take 1 tablet by mouth daily.     No current facility-administered medications for this visit.    Review of Systems  Constitutional:  Constitutional negative. HENT: HENT negative.  Eyes: Eyes negative.  Cardiovascular: Positive for leg swelling.  GI: Gastrointestinal negative.  Skin: Skin negative.  Neurological: Neurological negative. Hematologic: Hematologic/lymphatic negative.  Psychiatric: Psychiatric negative.  Objective:  Objective   Vitals:   10/30/20 1057  BP: (!) 158/78  Pulse: (!) 59  Resp: 20  Temp: 98.2 F (36.8 C)  SpO2: 97%  Weight: 170 lb (77.1 kg)  Height: 5\' 7"  (1.702 m)   Body mass index is 26.63 kg/m.  Physical Exam HENT:     Head: Normocephalic.     Nose:     Comments: Wearing a mask Eyes:     Pupils: Pupils are equal, round, and reactive to light.  Cardiovascular:     Rate and Rhythm: Normal rate.     Pulses: Normal pulses.  Pulmonary:     Effort: Pulmonary effort is normal.  Abdominal:     General: Abdomen is flat.     Palpations: Abdomen is soft.  Musculoskeletal:     Cervical back: Normal range of motion and neck supple.     Comments: Significant  left lower extremity varicosities he has very few on the right Bilateral ankle spider telangiectasias  Skin:    Capillary Refill: Capillary refill takes less than 2 seconds.     Comments: Left ankle with hemosiderin deposition  Neurological:     General: No focal deficit present.     Mental Status: He is alert.  Psychiatric:        Mood and Affect: Mood normal.        Behavior: Behavior normal.        Thought Content: Thought content normal.        Judgment: Judgment normal.     Data: CT IMPRESSION: 1. Groin swelling correlates with extensive varix formation spanning the bilateral greater saphenous veins. No definite underlying cause, although May-Thurner syndrome is considered. 2. Borderline fatty bilateral inguinal hernia. 3. Dysmorphic lumbar spine with mild scoliosis.   IVC/Iliac Findings:  +----------+------+--------+--------+    IVC  PatentThrombusComments  +----------+------+--------+--------+  IVC Prox patent          +----------+------+--------+--------+  IVC Mid  patent          +----------+------+--------+--------+  IVC Distalpatent          +----------+------+--------+--------+     +-------------------+---------+-----------+---------+-----------+--------+      CIV    RT-PatentRT-ThrombusLT-PatentLT-ThrombusComments  +-------------------+---------+-----------+---------+-----------+--------+  Common Iliac Prox  patent        patent             +-------------------+---------+-----------+---------+-----------+--------+  Common Iliac Mid   patent        patent             +-------------------+---------+-----------+---------+-----------+--------+  Common Iliac Distal patent        patent             +-------------------+---------+-----------+---------+-----------+--------+       +-------------------------+---------+-----------+---------+-----------+----  ----+        EIV       RT-PatentRT-ThrombusLT-PatentLT-ThrombusComments  +-------------------------+---------+-----------+---------+-----------+----  ----+  External Iliac Vein Prox  patent        patent              +-------------------------+---------+-----------+---------+-----------+----  ----+  External Iliac Vein Mid  patent        patent              +-------------------------+---------+-----------+---------+-----------+----  ----+  External Iliac Vein    patent        patent              Distal                                       +-------------------------+---------+-----------+---------+-----------+----  ----+  Assessment/Plan:    65 year old male presents with left lower extremity greater than right lower extremity swelling and varicosities and skin changes of the left ankle.  He does have a history of a bleeding varicosity on the left this was 11 years ago.  No history of DVT.  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  I have offered the patient to have venogram at this time he would rather not have any procedures and would rather follow-up in 3 months with lower extremity venous reflux studies.  He can continue to wear his gentle compression at this time if he does have significant reflux and we consider saphenous vein ablation he would need thigh-high moderate to tight compression which I discussed with him today.  At this time he wants follow-up in 3 months with reflux studies.     Waynetta Sandy MD [facility]

## 2020-10-30 NOTE — H&P (View-Only) (Signed)
Patient ID: Ricky [name], male   DOB: [birth year], 65 y.o.   MRN: [id number]  Reason for Consult: New Patient (Initial Visit)   Referred by Adaline Sill, NP  Subjective:     HPI:  Ricky [name] is a 65 y.o. male history of bilateral lower extremity swelling left greater than right.  He also has varicosities in the left greater than right lower extremities.  He states that he did have one bleeding episode in his left ankle varicosity this was 11 years ago.  He does have knee-high gentle compression that he wears at home.  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  He has had coronary angiography in the past no previous venous interventions.  He denies any history of DVT.  He does not take any blood thinners.  Past Medical History:  Diagnosis Date  . Allergy   . CAD (coronary artery disease)    Normal coronaries (2006)  . Dyslipidemia   . Hypertension   . MI (myocardial infarction) ([place]) 2007  . Serrated adenoma of colon 04/2011   Family History  Problem Relation Age of Onset  . Breast cancer Sister 30       deceased  . Colon cancer Neg Hx   . Liver disease Neg Hx    Past Surgical History:  Procedure Laterality Date  . CARDIAC CATHETERIZATION    . COLONOSCOPY  05/03/2011   Dr Rourk-serrated adenomas,hemorrhoids  . COLONOSCOPY  05/10/2011   post-polypectomy bleed-reolution clip  . COLONOSCOPY N/A 05/11/2018   Procedure: COLONOSCOPY;  Surgeon: Daneil Dolin, MD;  Location: [facility];  Service: Endoscopy;  Laterality: N/A;  10:30  . INGUINAL HERNIA REPAIR  2007   left  . INGUINAL HERNIA REPAIR  2002   right  . POLYPECTOMY  05/11/2018   Procedure: POLYPECTOMY;  Surgeon: Daneil Dolin, MD;  Location: [facility];  Service: Endoscopy;;  . VARICOSE VEIN SURGERY      Short Social History:  Social History   Tobacco Use  . Smoking status: Former Smoker    Years: 0.00    Quit date: 08/22/1994    Years since quitting: 26.2  . Smokeless tobacco: Never Used   Substance Use Topics  . Alcohol use: No    Allergies  Allergen Reactions  . Lipitor [Atorvastatin Calcium] Other (See Comments)    Joint pain    Current Outpatient Medications  Medication Sig Dispense Refill  . azelastine (OPTIVAR) 0.05 % ophthalmic solution Apply to eye.    . ezetimibe (ZETIA) 10 MG tablet Take 10 mg by mouth daily.    . furosemide (LASIX) 20 MG tablet Take 20 mg by mouth daily.    Marland Kitchen MEGARED OMEGA-3 KRILL OIL 500 MG CAPS Take 500 mg by mouth daily.    . metoprolol (LOPRESSOR) 50 MG tablet Take 50 mg by mouth 2 (two) times daily.    . Multiple Vitamins-Minerals (MULTIVITAMINS THER. W/MINERALS) TABS Take 1 tablet by mouth daily.    . Pitavastatin Calcium 4 MG TABS Take 4 mg by mouth daily.     . valsartan-hydrochlorothiazide (DIOVAN-HCT) 320-12.5 MG tablet Take 1 tablet by mouth daily.     No current facility-administered medications for this visit.    Review of Systems  Constitutional:  Constitutional negative. HENT: HENT negative.  Eyes: Eyes negative.  Cardiovascular: Positive for leg swelling.  GI: Gastrointestinal negative.  Skin: Skin negative.  Neurological: Neurological negative. Hematologic: Hematologic/lymphatic negative.  Psychiatric: Psychiatric negative.  Objective:  Objective   Vitals:   10/30/20 1057  BP: (!) 158/78  Pulse: (!) 59  Resp: 20  Temp: 98.2 F (36.8 C)  SpO2: 97%  Weight: 170 lb (77.1 kg)  Height: 5\' 7"  (1.702 m)   Body mass index is 26.63 kg/m.  Physical Exam HENT:     Head: Normocephalic.     Nose:     Comments: Wearing a mask Eyes:     Pupils: Pupils are equal, round, and reactive to light.  Cardiovascular:     Rate and Rhythm: Normal rate.     Pulses: Normal pulses.  Pulmonary:     Effort: Pulmonary effort is normal.  Abdominal:     General: Abdomen is flat.     Palpations: Abdomen is soft.  Musculoskeletal:     Cervical back: Normal range of motion and neck supple.     Comments: Significant  left lower extremity varicosities he has very few on the right Bilateral ankle spider telangiectasias  Skin:    Capillary Refill: Capillary refill takes less than 2 seconds.     Comments: Left ankle with hemosiderin deposition  Neurological:     General: No focal deficit present.     Mental Status: He is alert.  Psychiatric:        Mood and Affect: Mood normal.        Behavior: Behavior normal.        Thought Content: Thought content normal.        Judgment: Judgment normal.     Data: CT IMPRESSION: 1. Groin swelling correlates with extensive varix formation spanning the bilateral greater saphenous veins. No definite underlying cause, although May-Thurner syndrome is considered. 2. Borderline fatty bilateral inguinal hernia. 3. Dysmorphic lumbar spine with mild scoliosis.   IVC/Iliac Findings:  +----------+------+--------+--------+    IVC  PatentThrombusComments  +----------+------+--------+--------+  IVC Prox patent          +----------+------+--------+--------+  IVC Mid  patent          +----------+------+--------+--------+  IVC Distalpatent          +----------+------+--------+--------+     +-------------------+---------+-----------+---------+-----------+--------+      CIV    RT-PatentRT-ThrombusLT-PatentLT-ThrombusComments  +-------------------+---------+-----------+---------+-----------+--------+  Common Iliac Prox  patent        patent             +-------------------+---------+-----------+---------+-----------+--------+  Common Iliac Mid   patent        patent             +-------------------+---------+-----------+---------+-----------+--------+  Common Iliac Distal patent        patent             +-------------------+---------+-----------+---------+-----------+--------+       +-------------------------+---------+-----------+---------+-----------+----  ----+        EIV       RT-PatentRT-ThrombusLT-PatentLT-ThrombusComments  +-------------------------+---------+-----------+---------+-----------+----  ----+  External Iliac Vein Prox  patent        patent              +-------------------------+---------+-----------+---------+-----------+----  ----+  External Iliac Vein Mid  patent        patent              +-------------------------+---------+-----------+---------+-----------+----  ----+  External Iliac Vein    patent        patent              Distal                                       +-------------------------+---------+-----------+---------+-----------+----  ----+  Assessment/Plan:    65 year old male presents with left lower extremity greater than right lower extremity swelling and varicosities and skin changes of the left ankle.  He does have a history of a bleeding varicosity on the left this was 11 years ago.  No history of DVT.  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  I have offered the patient to have venogram at this time he would rather not have any procedures and would rather follow-up in 3 months with lower extremity venous reflux studies.  He can continue to wear his gentle compression at this time if he does have significant reflux and we consider saphenous vein ablation he would need thigh-high moderate to tight compression which I discussed with him today.  At this time he wants follow-up in 3 months with reflux studies.     Waynetta Sandy MD [facility]

## 2020-11-02 ENCOUNTER — Other Ambulatory Visit: Payer: Self-pay

## 2020-11-02 ENCOUNTER — Telehealth: Payer: Self-pay

## 2020-11-02 DIAGNOSIS — I871 Compression of vein: Secondary | ICD-10-CM

## 2020-11-02 NOTE — Telephone Encounter (Signed)
Per Dr. Donzetta Matters, Okay to schedule for left lower extremity venography in the Cath Lab.  Spoke with pt. Informed of provider recommendations. Pt scheduled for 11/16/20. Instructions provided to pt and verbal permission given to speak to sister Tawny Asal. Both parties verbalized understanding. Recommend pt for future reference, to complete a DPR form providing permission to sister. Voiced understanding.

## 2020-11-02 NOTE — Telephone Encounter (Signed)
Pt called office stating he decided he's ready to proceed with procedure with Dr. Donzetta Matters. Pt seen in office on 10/30/20. Advised pt will confirm procedure with Dr. Donzetta Matters and callback to schedule. Voiced understanding.   Dr. Donzetta Matters- Should I schedule pt for a LLE venogram?

## 2020-11-14 ENCOUNTER — Other Ambulatory Visit (HOSPITAL_COMMUNITY)
Admission: RE | Admit: 2020-11-14 | Discharge: 2020-11-14 | Disposition: A | Discharge status: Home / Self Care | Payer: BC Managed Care – PPO | Source: Ambulatory Visit | Attending: Vascular Surgery | Admitting: Vascular Surgery

## 2020-11-14 DIAGNOSIS — E785 Hyperlipidemia, unspecified: Secondary | ICD-10-CM | POA: Diagnosis not present

## 2020-11-14 DIAGNOSIS — I10 Essential (primary) hypertension: Secondary | ICD-10-CM | POA: Diagnosis not present

## 2020-11-14 DIAGNOSIS — I252 Old myocardial infarction: Secondary | ICD-10-CM | POA: Diagnosis not present

## 2020-11-14 DIAGNOSIS — Z20822 Contact with and (suspected) exposure to covid-19: Secondary | ICD-10-CM | POA: Diagnosis not present

## 2020-11-14 DIAGNOSIS — Z87891 Personal history of nicotine dependence: Secondary | ICD-10-CM | POA: Diagnosis not present

## 2020-11-14 DIAGNOSIS — I871 Compression of vein: Secondary | ICD-10-CM | POA: Diagnosis present

## 2020-11-14 DIAGNOSIS — Z01812 Encounter for preprocedural laboratory examination: Secondary | ICD-10-CM | POA: Insufficient documentation

## 2020-11-14 DIAGNOSIS — Z79899 Other long term (current) drug therapy: Secondary | ICD-10-CM | POA: Diagnosis not present

## 2020-11-14 DIAGNOSIS — Z888 Allergy status to other drugs, medicaments and biological substances status: Secondary | ICD-10-CM | POA: Diagnosis not present

## 2020-11-14 DIAGNOSIS — I2510 Atherosclerotic heart disease of native coronary artery without angina pectoris: Secondary | ICD-10-CM | POA: Diagnosis not present

## 2020-11-14 LAB — SARS CORONAVIRUS 2 (TAT 6-24 HRS): SARS Coronavirus 2: NEGATIVE

## 2020-11-16 ENCOUNTER — Other Ambulatory Visit: Payer: Self-pay

## 2020-11-16 ENCOUNTER — Ambulatory Visit (HOSPITAL_COMMUNITY)
Admission: RE | Disposition: A | Discharge status: Home / Self Care | Payer: Self-pay | Source: Home / Self Care | Attending: Vascular Surgery

## 2020-11-16 ENCOUNTER — Ambulatory Visit (HOSPITAL_COMMUNITY)
Admission: RE | Admit: 2020-11-16 | Discharge: 2020-11-16 | Disposition: A | Discharge status: Home / Self Care | Payer: BC Managed Care – PPO | Attending: Vascular Surgery | Admitting: Vascular Surgery

## 2020-11-16 DIAGNOSIS — I871 Compression of vein: Secondary | ICD-10-CM

## 2020-11-16 DIAGNOSIS — Z79899 Other long term (current) drug therapy: Secondary | ICD-10-CM | POA: Insufficient documentation

## 2020-11-16 DIAGNOSIS — I10 Essential (primary) hypertension: Secondary | ICD-10-CM | POA: Insufficient documentation

## 2020-11-16 DIAGNOSIS — Z20822 Contact with and (suspected) exposure to covid-19: Secondary | ICD-10-CM | POA: Diagnosis not present

## 2020-11-16 DIAGNOSIS — E785 Hyperlipidemia, unspecified: Secondary | ICD-10-CM | POA: Diagnosis not present

## 2020-11-16 DIAGNOSIS — Z888 Allergy status to other drugs, medicaments and biological substances status: Secondary | ICD-10-CM | POA: Insufficient documentation

## 2020-11-16 DIAGNOSIS — Z87891 Personal history of nicotine dependence: Secondary | ICD-10-CM | POA: Insufficient documentation

## 2020-11-16 DIAGNOSIS — I2510 Atherosclerotic heart disease of native coronary artery without angina pectoris: Secondary | ICD-10-CM | POA: Insufficient documentation

## 2020-11-16 DIAGNOSIS — I252 Old myocardial infarction: Secondary | ICD-10-CM | POA: Insufficient documentation

## 2020-11-16 HISTORY — PX: PERIPHERAL VASCULAR INTERVENTION: CATH118257

## 2020-11-16 HISTORY — PX: LOWER EXTREMITY VENOGRAPHY: CATH118253

## 2020-11-16 LAB — POCT I-STAT, CHEM 8
BUN: 15 mg/dL (ref 8–23)
Calcium, Ion: 1.34 mmol/L (ref 1.15–1.40)
Chloride: 99 mmol/L (ref 98–111)
Creatinine, Ser: 0.6 mg/dL — ABNORMAL LOW (ref 0.61–1.24)
Glucose, Bld: 100 mg/dL — ABNORMAL HIGH (ref 70–99)
HCT: 43 % (ref 39.0–52.0)
Hemoglobin: 14.6 g/dL (ref 13.0–17.0)
Potassium: 4.3 mmol/L (ref 3.5–5.1)
Sodium: 137 mmol/L (ref 135–145)
TCO2: 29 mmol/L (ref 22–32)

## 2020-11-16 SURGERY — LOWER EXTREMITY VENOGRAPHY
Anesthesia: LOCAL

## 2020-11-16 MED ORDER — LABETALOL HCL 5 MG/ML IV SOLN: 10.0000 mg | INTRAVENOUS | Status: DC | PRN

## 2020-11-16 MED ORDER — MORPHINE SULFATE (PF) 2 MG/ML IV SOLN
2.0000 mg | INTRAVENOUS | Status: DC | PRN
Start: 2020-11-16 — End: 2020-11-16

## 2020-11-16 MED ORDER — SODIUM CHLORIDE 0.9 % IV SOLN: 250.0000 mL | INTRAVENOUS | Status: DC | PRN

## 2020-11-16 MED ORDER — MIDAZOLAM HCL 2 MG/2ML IJ SOLN
INTRAMUSCULAR | Status: DC | PRN
  Administered 2020-11-16: 1 mg via INTRAVENOUS

## 2020-11-16 MED ORDER — HEPARIN (PORCINE) IN NACL 1000-0.9 UT/500ML-% IV SOLN
INTRAVENOUS | Status: DC | PRN
  Administered 2020-11-16: 500 mL

## 2020-11-16 MED ORDER — LIDOCAINE HCL (PF) 1 % IJ SOLN
INTRAMUSCULAR | Status: AC
  Filled 2020-11-16: qty 30

## 2020-11-16 MED ORDER — HEPARIN SODIUM (PORCINE) 1000 UNIT/ML IJ SOLN
INTRAMUSCULAR | Status: DC | PRN
  Administered 2020-11-16: 8000 [IU] via INTRAVENOUS

## 2020-11-16 MED ORDER — HEPARIN (PORCINE) IN NACL 1000-0.9 UT/500ML-% IV SOLN
INTRAVENOUS | Status: AC
  Filled 2020-11-16: qty 500

## 2020-11-16 MED ORDER — SODIUM CHLORIDE 0.9 % WEIGHT BASED INFUSION: 1.0000 mL/kg/h | INTRAVENOUS | Status: DC

## 2020-11-16 MED ORDER — HYDRALAZINE HCL 20 MG/ML IJ SOLN: 5.0000 mg | INTRAMUSCULAR | Status: DC | PRN

## 2020-11-16 MED ORDER — CLOPIDOGREL BISULFATE 75 MG PO TABS: 75.0000 mg | ORAL_TABLET | Freq: Every day | ORAL | Status: DC

## 2020-11-16 MED ORDER — OXYCODONE HCL 5 MG PO TABS
5.0000 mg | ORAL_TABLET | ORAL | Status: DC | PRN
Start: 2020-11-16 — End: 2020-11-16

## 2020-11-16 MED ORDER — IODIXANOL 320 MG/ML IV SOLN
INTRAVENOUS | Status: DC | PRN
  Administered 2020-11-16: 15 mL via INTRAVENOUS

## 2020-11-16 MED ORDER — SODIUM CHLORIDE 0.9% FLUSH: 3.0000 mL | INTRAVENOUS | Status: DC | PRN

## 2020-11-16 MED ORDER — CLOPIDOGREL BISULFATE 300 MG PO TABS
ORAL_TABLET | ORAL | Status: AC
  Filled 2020-11-16: qty 1

## 2020-11-16 MED ORDER — ACETAMINOPHEN 325 MG PO TABS: 650.0000 mg | ORAL_TABLET | ORAL | Status: DC | PRN

## 2020-11-16 MED ORDER — CLOPIDOGREL BISULFATE 75 MG PO TABS: 300.0000 mg | ORAL_TABLET | Freq: Once | ORAL | Status: DC

## 2020-11-16 MED ORDER — MIDAZOLAM HCL 2 MG/2ML IJ SOLN
INTRAMUSCULAR | Status: AC
  Filled 2020-11-16: qty 2

## 2020-11-16 MED ORDER — FENTANYL CITRATE (PF) 100 MCG/2ML IJ SOLN
INTRAMUSCULAR | Status: DC | PRN
  Administered 2020-11-16: 50 ug via INTRAVENOUS

## 2020-11-16 MED ORDER — SODIUM CHLORIDE 0.9% FLUSH: 3.0000 mL | Freq: Two times a day (BID) | INTRAVENOUS | Status: DC

## 2020-11-16 MED ORDER — CLOPIDOGREL BISULFATE 300 MG PO TABS
ORAL_TABLET | ORAL | Status: DC | PRN
  Administered 2020-11-16: 300 mg via ORAL

## 2020-11-16 MED ORDER — HEPARIN SODIUM (PORCINE) 1000 UNIT/ML IJ SOLN
INTRAMUSCULAR | Status: AC
  Filled 2020-11-16: qty 1

## 2020-11-16 MED ORDER — SODIUM CHLORIDE 0.9 % IV SOLN: INTRAVENOUS | Status: DC

## 2020-11-16 MED ORDER — CLOPIDOGREL BISULFATE 75 MG PO TABS: 75.0000 mg | ORAL_TABLET | Freq: Every day | ORAL | 11 refills | Status: AC

## 2020-11-16 MED ORDER — ONDANSETRON HCL 4 MG/2ML IJ SOLN: 4.0000 mg | Freq: Four times a day (QID) | INTRAMUSCULAR | Status: DC | PRN

## 2020-11-16 MED ORDER — FENTANYL CITRATE (PF) 100 MCG/2ML IJ SOLN
INTRAMUSCULAR | Status: AC
  Filled 2020-11-16: qty 2

## 2020-11-16 MED ORDER — ASPIRIN EC 81 MG PO TBEC: 81.0000 mg | DELAYED_RELEASE_TABLET | Freq: Every day | ORAL | Status: DC

## 2020-11-16 SURGICAL SUPPLY — 17 items
BALLN ATLAS 14X40X75 (BALLOONS) ×3
BALLN MUSTANG 12.0X40 75 (BALLOONS) ×3
BALLOON ATLAS 14X40X75 (BALLOONS) IMPLANT
BALLOON MUSTANG 12.0X40 75 (BALLOONS) IMPLANT
CATH ANGIO 5F BER2 100CM (CATHETERS) ×1 IMPLANT
CATH VISIONS PV .035 IVUS (CATHETERS) ×1 IMPLANT
GLIDEWIRE ADV .035X260CM (WIRE) ×1 IMPLANT
KIT ENCORE 40 (KITS) ×1 IMPLANT
KIT MICROPUNCTURE NIT STIFF (SHEATH) ×1 IMPLANT
KIT PV (KITS) ×3 IMPLANT
SHEATH PINNACLE 5F 10CM (SHEATH) ×1 IMPLANT
SHEATH PINNACLE 8F 10CM (SHEATH) ×1 IMPLANT
SHEATH PINNACLE 9F 10CM (SHEATH) ×1 IMPLANT
STENT WALLSTENTÂ  16X60X75 (Permanent Stent) ×1 IMPLANT
TRANSDUCER W/STOPCOCK (MISCELLANEOUS) ×3 IMPLANT
TRAY PV CATH (CUSTOM PROCEDURE TRAY) ×3 IMPLANT
WIRE BENTSON .035X145CM (WIRE) ×1 IMPLANT

## 2020-11-16 NOTE — Op Note (Addendum)
    Patient name: EFREM PITSTICK MRN: [id number] DOB: 19-May-1956 Sex: male  11/16/2020 Pre-operative Diagnosis: may thurner Post-operative diagnosis:  Same Surgeon:  Erlene Quan C. Donzetta Matters, MD Procedure Performed: 1.  Ultrasound-guided cannulation left popliteal vein 2.  Intravascular ultrasound left popliteal, left femoral, left common femoral, left external and common iliac veins and IVC 3.  Left lower extremity and central venography 4.  Stent of left common iliac vein with 16 x 60 mm Wallstent postdilated with 12 and 14 mm balloons 5.  Moderate sedation with fentanyl Versed for 37 minutes  Indications: 65 year old male with history of left lower extremity greater than right lower extremity swelling with also varicose veins in the left lower extremity.  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  Findings: The left lower extremity did have rouleaux flow.  By intravascular ultrasound.  This included the left popliteal, left femoral, left common femoral and left external iliac veins left common iliac vein was flattened down to approximately 1 mm although he did have a medial to lateral diameter of approximately 12 mm just proximal and distal to what did appear to be very tight stenosis so much so that the IVUS had trouble getting through initially.  After stenting and balloon angioplasty we had a lumen of 11 mm where previously was 1 mm.  The rouleaux flow in the left lower extremity was significantly improved.   Procedure:  The patient was identified in the holding area and taken to room 8.  The patient was then placed prone on the table and prepped and draped in the usual sterile fashion.  A time out was called.  Ultrasound was used to evaluate the left popliteal vein.  This was actually not very compressible.  The areas anesthetized 1% lidocaine cannulated with micropuncture needle followed by wire sheath.  And images saved the permanent record.   Moderate sedation with fentanyl and Versed was administered his vital signs were monitored throughout the case.  A Bentson wire was placed followed by initially 5 [place] sheath.  We used a Berenstein catheter and Glidewire advantage to cross the subtotally occluded segment of the left common iliac vein.  We then placed the wire into the right subclavian vein marked the wire on our draping and the patient was fully heparinized.  We exchanged for an 8 [place] sheath.  We perform intravascular ultrasound from the knee all the way to the IVC including the popliteal, left femoral, left common femoral, left external iliac veins which demonstrated rouleaux flow.  Venogram was performed as well.  We then primarily stented with 16 x 60 mm Wallstent and postdilated with 12 and 14 mm balloons.  Completion demonstrated a diameter of 11 mm were previously was 1 mm by intravascular ultrasound.  We performed completion angiography and intravascular ultrasound to demonstrate our diameters and decrease rouleaux flow.  We then removed our sheath and sutured the access site with 4-0 Monocryl suture.  Patient did tolerate procedure well without immediate complication.   Contrast: 15cc  Salif Tay C. Donzetta Matters, MD [facility] Office: [phone] Pager: [phone]

## 2020-11-16 NOTE — Interval H&P Note (Signed)
History and Physical Interval Note:  11/16/2020 7:16 AM  Ricky [name]  has presented today for surgery, with the diagnosis of may-thurner.  The various methods of treatment have been discussed with the patient and family. After consideration of risks, benefits and other options for treatment, the patient has consented to  Procedure(s): LOWER EXTREMITY VENOGRAPHY (N/A) as a surgical intervention.  The patient's history has been reviewed, patient examined, no change in status, stable for surgery.  I have reviewed the patient's chart and labs.  Questions were answered to the patient's satisfaction.     Servando Snare

## 2020-11-16 NOTE — Discharge Instructions (Signed)
Popliteal Site Care  This sheet gives you information about how to care for yourself after your procedure. Your health care provider may also give you more specific instructions. If you have problems or questions, contact your health care provider. What can I expect after the procedure? After the procedure, it is common to have:  Bruising that usually fades within 1-2 weeks.  Tenderness at the site. Follow these instructions at home: Wound care  Follow instructions from your health care provider about how to take care of your insertion site. Make sure you: ? Wash your hands with soap and water before you change your bandage (dressing). If soap and water are not available, use hand sanitizer. ? Remove your dressing as told by your health care provider in 24 hours.  Do not take baths, swim, or use a hot tub until your health care provider approves.  You may shower 24-48 hours after the procedure or as told by your health care provider. ? Gently wash the site with plain soap and water. ? Pat the area dry with a clean towel. ? Do not rub the site. This may cause bleeding.  Do not apply powder or lotion to the site. Keep the site clean and dry.  Check your site every day for signs of infection. Check for: ? Redness, swelling, or pain. ? Fluid or blood. ? Warmth. ? Pus or a bad smell. Activity  For the first 2 days after your procedure, or as long as directed: ? Avoid climbing stairs as much as possible. ? Do not squat.  Do not lift anything that is heavier than 10 lb (4.5 kg), or the limit that you are told, until your health care provider says that it is safe for 2 days.  Rest as directed. ? Avoid sitting for a long time without moving. Get up to take short walks every 1-2 hours.  Do not drive for 24 hours if you were given a medicine to help you relax (sedative). General instructions  Take over-the-counter and prescription medicines only as told by your health care  provider.  Keep all follow-up visits as told by your health care provider. This is important. Contact a health care provider if you have:  A fever or chills.  You have redness, swelling, or pain around your insertion site. Get help right away if:  The catheter insertion area swells very fast.  You pass out.  You suddenly start to sweat or your skin gets clammy.  The catheter insertion area is bleeding, and the bleeding does not stop when you hold steady pressure on the area. These symptoms may represent a serious problem that is an emergency. Do not wait to see if the symptoms will go away. Get medical help right away. Call your local emergency services (911 in the [place] Do not drive yourself to the hospital. Summary  After the procedure, it is common to have bruising that usually fades within 1-2 weeks.  Check your popliteal site every day for signs of infection.  Do not lift anything that is heavier than 10 lb (4.5 kg), or the limit that you are told, until your health care provider says that it is safe. This information is not intended to replace advice given to you by your health care provider. Make sure you discuss any questions you have with your health care provider. Document Revised: 04/10/2020 Document Reviewed: 04/10/2020 Elsevier Patient Education  [place].

## 2020-11-17 ENCOUNTER — Encounter (HOSPITAL_COMMUNITY): Payer: Self-pay | Admitting: Vascular Surgery

## 2020-11-17 MED FILL — Lidocaine HCl Local Preservative Free (PF) Inj 1%: INTRAMUSCULAR | Qty: 30 | Status: AC

## 2020-11-20 ENCOUNTER — Encounter (HOSPITAL_COMMUNITY): Payer: Self-pay | Admitting: Vascular Surgery

## 2020-12-15 ENCOUNTER — Other Ambulatory Visit: Payer: Self-pay

## 2020-12-15 DIAGNOSIS — I871 Compression of vein: Secondary | ICD-10-CM

## 2020-12-25 ENCOUNTER — Ambulatory Visit (INDEPENDENT_AMBULATORY_CARE_PROVIDER_SITE_OTHER)
Admission: RE | Admit: 2020-12-25 | Discharge: 2020-12-25 | Disposition: A | Discharge status: Home / Self Care | Payer: BC Managed Care – PPO | Source: Ambulatory Visit | Attending: Vascular Surgery | Admitting: Vascular Surgery

## 2020-12-25 ENCOUNTER — Other Ambulatory Visit: Payer: Self-pay

## 2020-12-25 ENCOUNTER — Ambulatory Visit (INDEPENDENT_AMBULATORY_CARE_PROVIDER_SITE_OTHER): Payer: BC Managed Care – PPO | Admitting: Physician Assistant

## 2020-12-25 ENCOUNTER — Ambulatory Visit (HOSPITAL_COMMUNITY)
Admission: RE | Admit: 2020-12-25 | Discharge: 2020-12-25 | Disposition: A | Discharge status: Home / Self Care | Payer: BC Managed Care – PPO | Source: Ambulatory Visit | Attending: Vascular Surgery | Admitting: Vascular Surgery

## 2020-12-25 VITALS — BP 140/85 | HR 79 | Temp 98.2°F | Resp 20 | Ht 68.0 in | Wt 164.4 lb

## 2020-12-25 DIAGNOSIS — I871 Compression of vein: Secondary | ICD-10-CM | POA: Diagnosis not present

## 2020-12-25 DIAGNOSIS — I872 Venous insufficiency (chronic) (peripheral): Secondary | ICD-10-CM

## 2020-12-25 DIAGNOSIS — Q279 Congenital malformation of peripheral vascular system, unspecified: Secondary | ICD-10-CM | POA: Insufficient documentation

## 2020-12-25 NOTE — Progress Notes (Signed)
[facility] [place]   Reason for referral: Swollen B leg  History of Present Illness  Ricky [name] is a 65 y.o. male who presents with chief complaint: swollen legs.  65 year old male with history of left lower extremity greater than right lower extremity swelling with also varicose veins in the left lower extremity.  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  He is here for a follow up exam s/p Stent of left common iliac vein with 16 x 60 mm Wallstent postdilated with 12 and 14 mm balloons.    He states he no longer has swelling, until the end of the day.  He has knee high compression , but he has not been wearing it.  He works full time as a Retail buyer.  He denise much discomfort, no wounds, claudication or rest pain.  He has obvious varicose veins.  A reflux study was ordered today for review.   He takes ASA and Plavix daily.  He does not tolerate Statins.   Past Medical History:  Diagnosis Date  . Allergy   . CAD (coronary artery disease)    Normal coronaries (2006)  . Dyslipidemia   . Hypertension   . MI (myocardial infarction) ([place]) 2007  . Serrated adenoma of colon 04/2011    Past Surgical History:  Procedure Laterality Date  . CARDIAC CATHETERIZATION    . COLONOSCOPY  05/03/2011   Dr Rourk-serrated adenomas,hemorrhoids  . COLONOSCOPY  05/10/2011   post-polypectomy bleed-reolution clip  . COLONOSCOPY N/A 05/11/2018   Procedure: COLONOSCOPY;  Surgeon: Daneil Dolin, MD;  [facility];  Service: Endoscopy;  Laterality: N/A;  10:30  . INGUINAL HERNIA REPAIR  2007   left  . INGUINAL HERNIA REPAIR  2002   right  . LOWER EXTREMITY VENOGRAPHY N/A 11/16/2020   Procedure: LOWER EXTREMITY VENOGRAPHY;  Surgeon: Waynetta Sandy, MD;  Location: [facility];  Service: Cardiovascular;  Laterality: N/A;  . PERIPHERAL VASCULAR INTERVENTION Left 11/16/2020   Procedure: PERIPHERAL VASCULAR  INTERVENTION;  Surgeon: Waynetta Sandy, MD;  Location: [facility];  Service: Cardiovascular;  Laterality: Left;  common iliac stent venous  . POLYPECTOMY  05/11/2018   Procedure: POLYPECTOMY;  Surgeon: Daneil Dolin, MD;  [facility];  Service: Endoscopy;;  . VARICOSE VEIN SURGERY      Social History   Socioeconomic History  . Marital status: Single    Spouse name: Not on file  . Number of children: Not on file  . Years of education: Not on file  . Highest education level: Not on file  Occupational History  . Not on file  Tobacco Use  . Smoking status: Former Smoker    Years: 0.00    Quit date: 08/22/1994    Years since quitting: 26.3  . Smokeless tobacco: Never Used  Vaping Use  . Vaping Use: Never used  Substance and Sexual Activity  . Alcohol use: No  . Drug use: No  . Sexual activity: Not on file  Other Topics Concern  . Not on file  Social History Narrative  . Not on file   Social Determinants of Health   Financial Resource Strain: Not on file  Food Insecurity: Not on file  Transportation Needs: Not on file  Physical Activity: Not on file  Stress: Not on file  Social Connections: Not on file  Intimate Partner Violence: Not on file  Family History  Problem Relation Age of Onset  . Breast cancer Sister 42       deceased  . Colon cancer Neg Hx   . Liver disease Neg Hx     Current Outpatient Medications on File Prior to Visit  Medication Sig Dispense Refill  . aspirin EC 81 MG tablet Take 81 mg by mouth daily. Swallow whole.    Marland Kitchen azelastine (OPTIVAR) 0.05 % ophthalmic solution Place 1 drop into both eyes 2 (two) times daily as needed (irritation).    . clopidogrel (PLAVIX) 75 MG tablet Take 1 tablet (75 mg total) by mouth daily. 30 tablet 11  . erythromycin ophthalmic ointment Place 1 application into both eyes daily as needed (irritation).    . ezetimibe (ZETIA) 10 MG tablet Take 10 mg by mouth daily.    . furosemide (LASIX)  20 MG tablet Take 20 mg by mouth daily.    Marland Kitchen ibuprofen (ADVIL) 200 MG tablet Take 200-400 mg by mouth every 8 (eight) hours as needed for headache.    . metoprolol (LOPRESSOR) 50 MG tablet Take 50 mg by mouth daily.    . Multiple Vitamins-Minerals (MULTIVITAMINS THER. W/MINERALS) TABS Take 1 tablet by mouth daily.    . valsartan-hydrochlorothiazide (DIOVAN-HCT) 320-25 MG tablet Take 1 tablet by mouth daily.     No current facility-administered medications on file prior to visit.    Allergies as of 12/25/2020 - Review Complete 12/25/2020  Allergen Reaction Noted  . Lipitor [atorvastatin calcium] Other (See Comments) 05/03/2011     ROS:   General:  No weight loss, Fever, chills  HEENT: No recent headaches, no nasal bleeding, no visual changes, no sore throat  Neurologic: No dizziness, blackouts, seizures. No recent symptoms of stroke or mini- stroke. No recent episodes of slurred speech, or temporary blindness.  Cardiac: No recent episodes of chest pain/pressure, no shortness of breath at rest.  No shortness of breath with exertion.  Denies history of atrial fibrillation or irregular heartbeat  Vascular: No history of rest pain in feet.  No history of claudication.  No history of non-healing ulcer, No history of DVT   Pulmonary: No home oxygen, no productive cough, no hemoptysis,  No asthma or wheezing  Musculoskeletal:  [ ]  Arthritis, [ ]  Low back pain,  [ ]  Joint pain  Hematologic:No history of hypercoagulable state.  No history of easy bleeding.  No history of anemia  Gastrointestinal: No hematochezia or melena,  No gastroesophageal reflux, no trouble swallowing  Urinary: [ ]  chronic Kidney disease, [ ]  on HD - [ ]  MWF or [ ]  TTHS, [ ]  Burning with urination, [ ]  Frequent urination, [ ]  Difficulty urinating;   Skin: No rashes  Psychological: No history of anxiety,  No history of depression  Physical Examination  Vitals:   12/25/20 0957  BP: 140/85  Pulse: 79  Resp: 20   Temp: 98.2 F (36.8 C)  TempSrc: Temporal  SpO2: 100%  Weight: 164 lb 6.4 oz (74.6 kg)  Height: 5\' 8"  (1.727 m)    Body mass index is 25 kg/m.  General:  Alert and oriented, no acute distress HEENT: Normal Neck: No bruit or JVD Pulmonary: Clear to auscultation bilaterally Cardiac: Regular Rate and Rhythm without murmur Abdomen: Soft, non-tender, non-distended, no mass, no scars Skin: No rash Extremity Pulses:  2+ radial, brachial, femoral, dorsalis pedis,  pulses bilaterally Musculoskeletal: No deformity or edema  Neurologic: Upper and lower extremity motor 5/5 and symmetric  DATA:  IVC/Iliac Findings:  +----------+------+--------+--------+    IVC  PatentThrombusComments  +----------+------+--------+--------+  IVC Prox patent          +----------+------+--------+--------+  IVC Mid  patent          +----------+------+--------+--------+  IVC Distalpatent          +----------+------+--------+--------+     +-------------------+---------+-----------+---------+-----------+--------+      CIV    RT-PatentRT-ThrombusLT-PatentLT-ThrombusComments  +-------------------+---------+-----------+---------+-----------+--------+  Common Iliac Prox  patent        patent             +-------------------+---------+-----------+---------+-----------+--------+  Common Iliac Mid   patent        patent             +-------------------+---------+-----------+---------+-----------+--------+  Common Iliac Distal patent        patent             +-------------------+---------+-----------+---------+-----------+--------+      +-------------------------+---------+-----------+---------+-----------+----  ----+        EIV       RT-PatentRT-ThrombusLT-PatentLT-ThrombusComments   +-------------------------+---------+-----------+---------+-----------+----  ----+  External Iliac Vein Prox  patent        patent              +-------------------------+---------+-----------+---------+-----------+----  ----+  External Iliac Vein Mid  patent        patent              +-------------------------+---------+-----------+---------+-----------+----  ----+  External Iliac Vein    patent        patent              Distal                                       +-------------------------+---------+-----------+---------+-----------+----  ----+    Summary:  IVC/Iliac: No evidence of thrombus in IVC and Iliac veins. Left CIV stent  patent.   Venous reflux study   +--------------+---------+------+-----------+------------+--------+  LEFT     Reflux NoRefluxReflux TimeDiameter cmsComments               Yes                   +--------------+---------+------+-----------+------------+--------+  CFV            yes  >1 second             +--------------+---------+------+-----------+------------+--------+  FV mid    no                         +--------------+---------+------+-----------+------------+--------+  Popliteal   no                         +--------------+---------+------+-----------+------------+--------+  GSV at SFJ        yes  >500 ms   0.92        +--------------+---------+------+-----------+------------+--------+  GSV prox thigh      yes  >500 ms   1.27        +--------------+---------+------+-----------+------------+--------+  GSV mid thigh       yes  >500 ms   0.43        +--------------+---------+------+-----------+------------+--------+  GSV dist  thigh      yes  >500 ms   0.49        +--------------+---------+------+-----------+------------+--------+  GSV at knee        yes  >500 ms   0.38        +--------------+---------+------+-----------+------------+--------+  GSV prox calf       yes  >500 ms   0.24        +--------------+---------+------+-----------+------------+--------+  SSV Pop Fossa       yes  >500 ms   0.47        +--------------+---------+------+-----------+------------+--------+  SSV prox calf       yes  >500 ms   0.46        +--------------+---------+------+-----------+------------+--------+  SSV mid calf       yes  >500 ms   0.49        +--------------+---------+------+-----------+------------+--------+   Assessment: May Thurner syndrome  s/p Stent of left common iliac vein with 16 x 60 mm Wallstent postdilated with 12 and 14 mm balloons.    Left:  - No evidence of deep vein thrombosis seen in the left lower extremity,  from the common femoral through the popliteal veins.  - No evidence of superficial venous thrombosis in the left lower  extremity.  - Venous reflux is noted in the left common femoral vein.  - Venous reflux is noted in the left sapheno-femoral junction.  - Venous reflux is noted in the left greater saphenous vein in the thigh.  - Venous reflux is noted in the left greater saphenous vein in the calf.  - Venous reflux is noted in the left short saphenous vein.  Plan: He is not interested in venous intervention at this time for reflux.  He has knee high compression at home.  He will wear his compression daily and elevate his legs when at rest.  He will f/u for surveillance and repeat IVC/Iliac ultrasound.  Continue ASA and Plavix.   If the edema progresses, or a venous wound develops he will call.  He has reflux at the [facility] and in the GCV as well as the SSV with vein size  > 0.4 throughout.  He will need thigh compression 20-30 mm hg and f/u with our vein clinic to be considered for laser ablation treatment.   Roxy Horseman PA-C [facility] [place] Office: [phone]  MD in clinic [place]

## 2020-12-28 ENCOUNTER — Other Ambulatory Visit: Payer: Self-pay

## 2020-12-28 DIAGNOSIS — I871 Compression of vein: Secondary | ICD-10-CM

## 2021-02-05 ENCOUNTER — Ambulatory Visit: Payer: BC Managed Care – PPO | Admitting: Vascular Surgery

## 2021-02-05 ENCOUNTER — Ambulatory Visit (HOSPITAL_COMMUNITY): Payer: BC Managed Care – PPO | Attending: Vascular Surgery

## 2021-11-19 IMAGING — CT CT ABD-PELV W/O CM
2 of 5 series · 15 of 46 positions shown, 17 images · non-contrast
Comparison: None.

CLINICAL DATA: History of left and right inguinal hernia with left
groin pain for about a year, now bulging.

EXAM:
CT ABDOMEN AND PELVIS WITHOUT CONTRAST
TECHNIQUE: Multidetector CT imaging of the abdomen and pelvis was performed
following the standard protocol without IV contrast.

[Series 3: thins · axial · 0.73mm/px · z∈[-765,-321]mm · 12 of 1045 slices shown, 14 images]
[im 78/1045  soft-tissue]
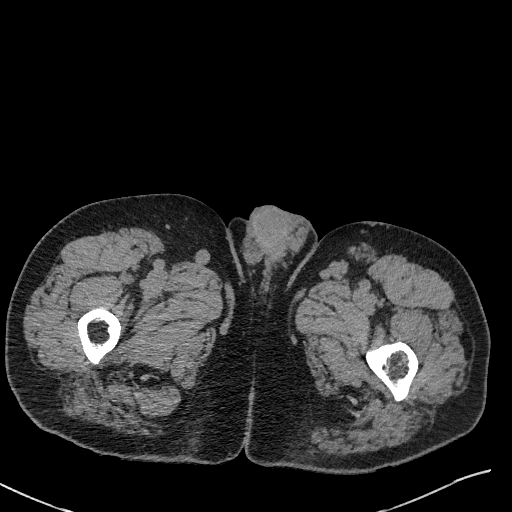
[im 78/1045  bone]
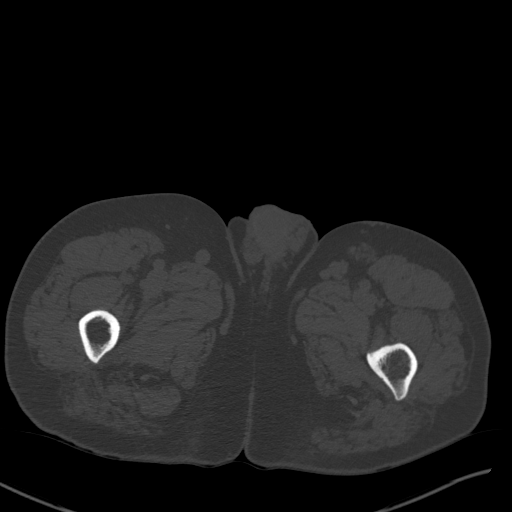
[im 155/1045  soft-tissue]
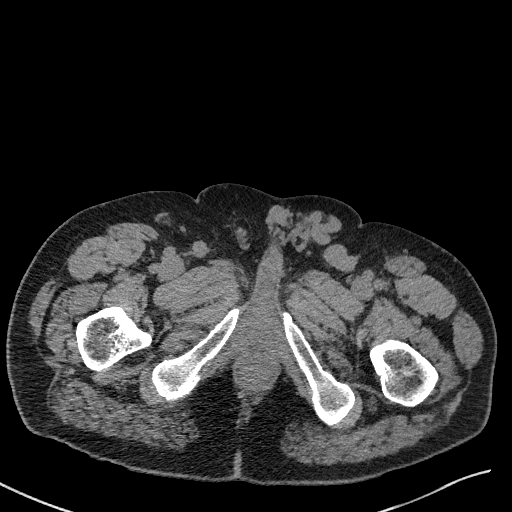
[im 233/1045  soft-tissue]
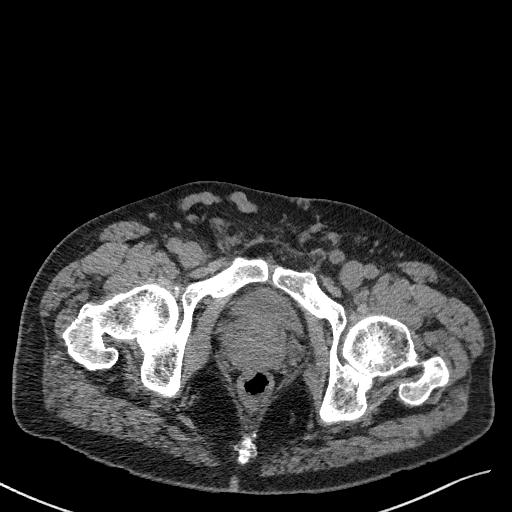
[im 310/1045  soft-tissue]
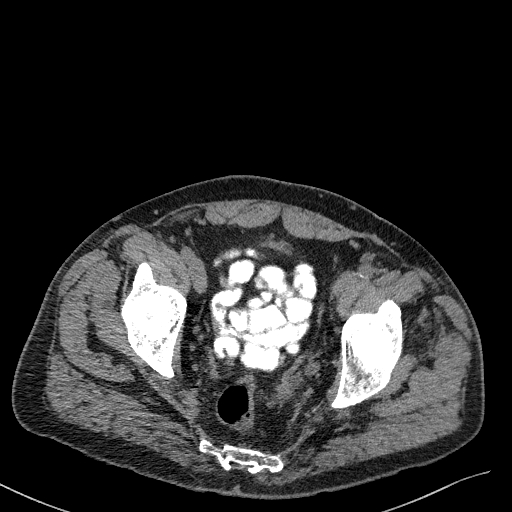
[im 387/1045  soft-tissue]
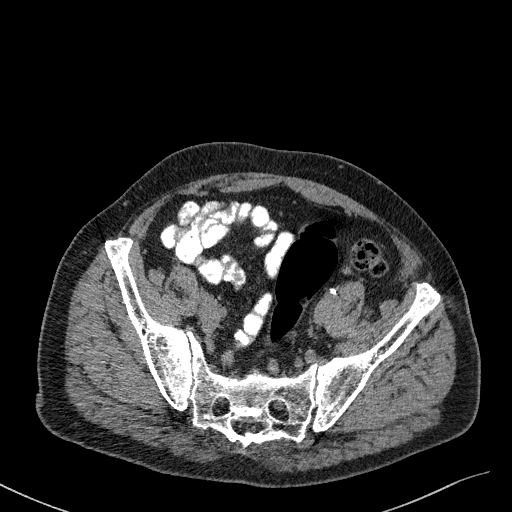
[im 465/1045  soft-tissue]
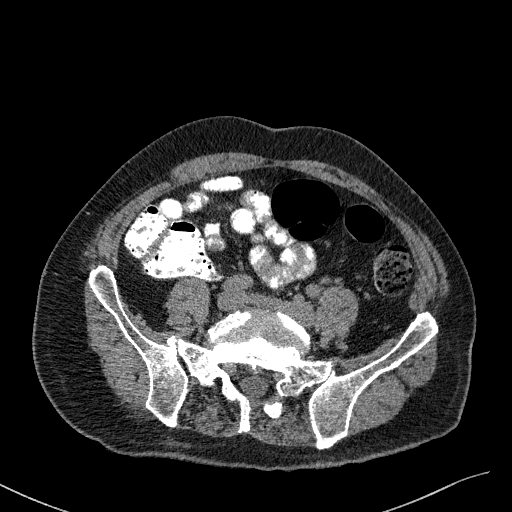
[im 581/1045  soft-tissue]
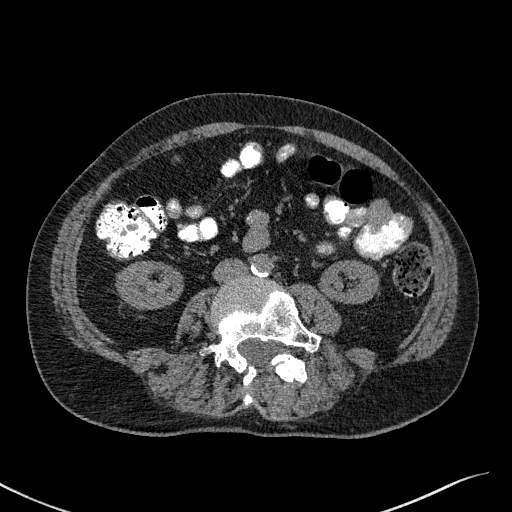
[im 658/1045  soft-tissue]
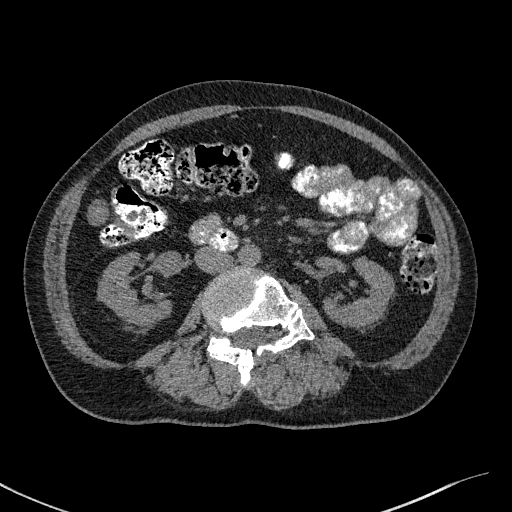
[im 735/1045  soft-tissue]
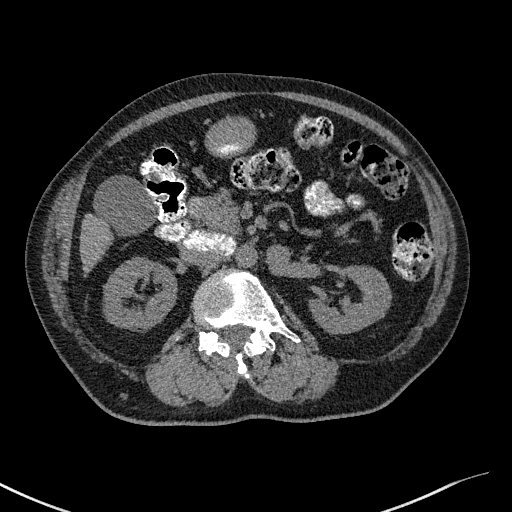
[im 735/1045  bone]
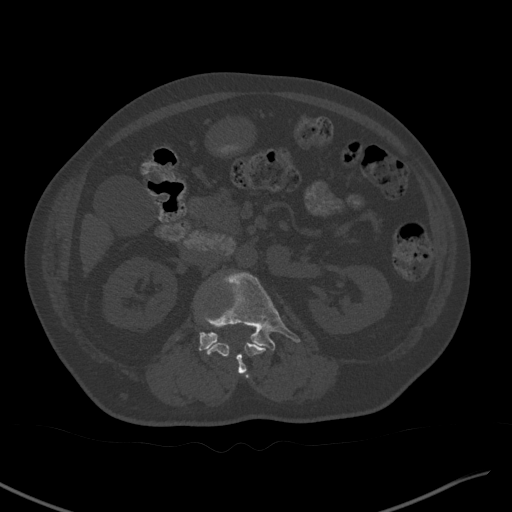
[im 813/1045  soft-tissue]
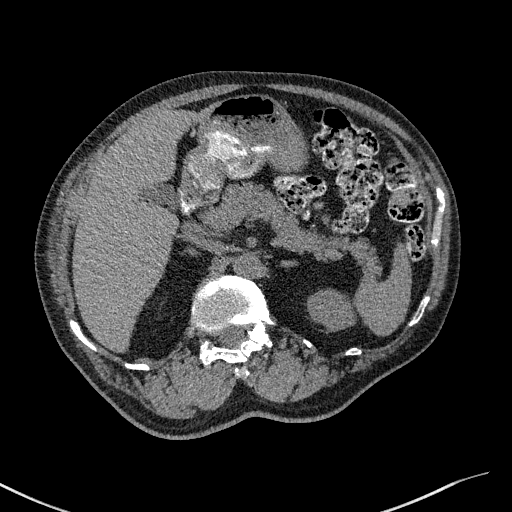
[im 890/1045  soft-tissue]
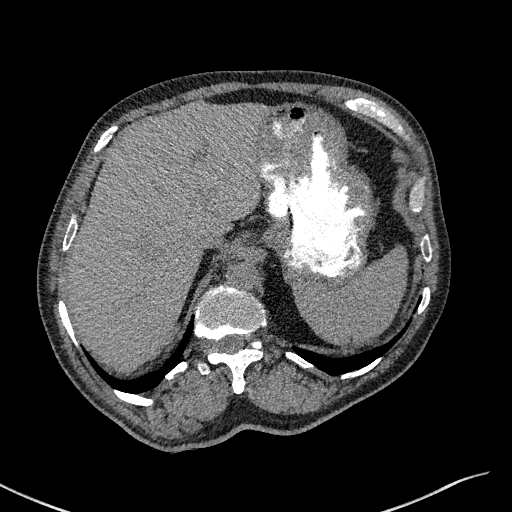
[im 967/1045  soft-tissue]
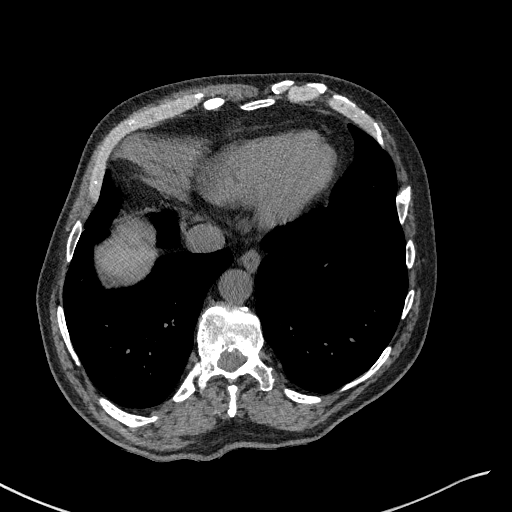

[Series 5: coronal st · coronal · 0.68mm/px · 3 of 95 slices shown]
[im 32/95  soft-tissue]
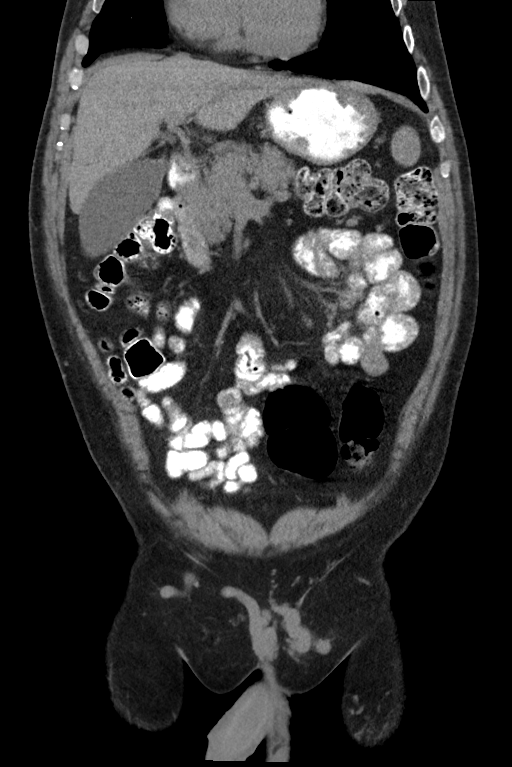
[im 42/95  soft-tissue]
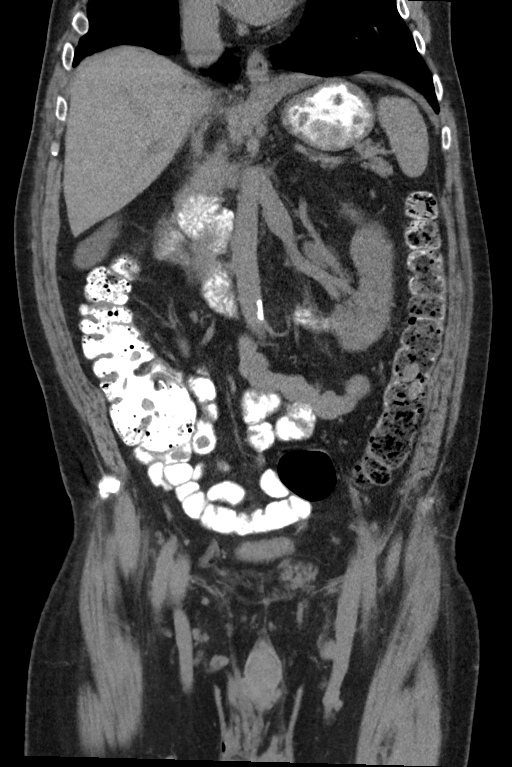
[im 53/95  soft-tissue]
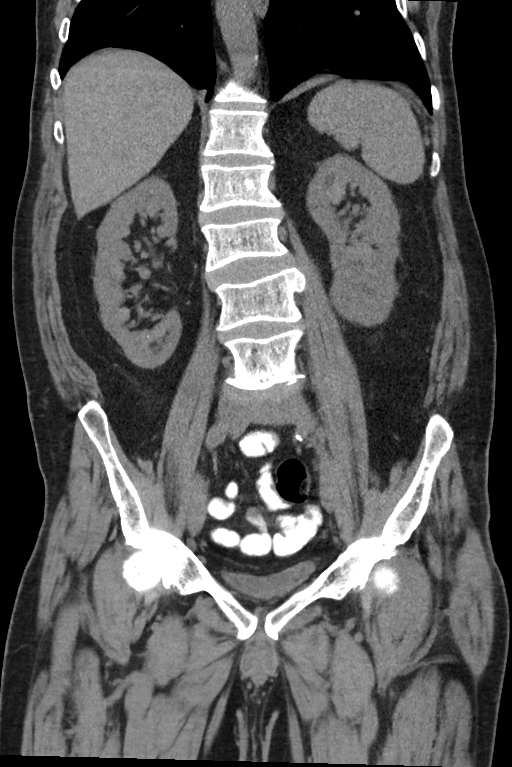

[15 of 46 positions shown; findings below may reference images not displayed]

FINDINGS: Lower chest:  Minor atelectasis

Hepatobiliary: No focal liver abnormality.No evidence of biliary
obstruction or stone.

Pancreas: Unremarkable.

Spleen: Unremarkable.

Adrenals/Urinary Tract: Negative adrenals. No hydronephrosis or
stone. Unremarkable bladder.

Stomach/Bowel:  No obstruction. No visible bowel inflammation.

Vascular/Lymphatic: Varices in the subcutaneous lower pelvis,
spanning bilateral great saphenous veins. The left common iliac vein
appears narrow as it crosses behind the iliac arteries. No other
clear cause of venous collateralization. No retroperitoneal mass or
adenopathy is seen.

Reproductive:No pathologic findings.

Other: No ascites or pneumoperitoneum. Mild fat bulging of the
bilateral inguinal canal.

Musculoskeletal: No acute abnormalities. L5 better flat vertebra
with dysmorphic posterior elements throughout the lumbar spine. Pars
defects are also seen, reaching the T11 level on the left.
IMPRESSION: 1. Groin swelling correlates with extensive varix formation spanning
the bilateral greater saphenous veins. No definite underlying cause,
although Raditnor syndrome is considered.
2. Borderline fatty bilateral inguinal hernia.
3. Dysmorphic lumbar spine with mild scoliosis.

## 2022-01-05 ENCOUNTER — Other Ambulatory Visit: Payer: Self-pay | Admitting: Vascular Surgery

## 2023-04-04 ENCOUNTER — Encounter: Payer: Self-pay | Admitting: *Deleted

## 2023-05-18 ENCOUNTER — Other Ambulatory Visit: Payer: Self-pay | Admitting: *Deleted

## 2023-05-18 DIAGNOSIS — I872 Venous insufficiency (chronic) (peripheral): Secondary | ICD-10-CM

## 2023-05-24 ENCOUNTER — Ambulatory Visit (HOSPITAL_COMMUNITY)
Admission: RE | Admit: 2023-05-24 | Discharge: 2023-05-24 | Disposition: A | Discharge status: Home / Self Care | Payer: BC Managed Care – PPO | Source: Ambulatory Visit | Attending: Vascular Surgery | Admitting: Vascular Surgery

## 2023-05-24 ENCOUNTER — Ambulatory Visit: Payer: BC Managed Care – PPO | Admitting: Vascular Surgery

## 2023-05-24 ENCOUNTER — Encounter: Payer: Self-pay | Admitting: Vascular Surgery

## 2023-05-24 VITALS — BP 154/80 | HR 74 | Temp 97.8°F | Resp 20 | Ht 68.0 in | Wt 165.0 lb

## 2023-05-24 DIAGNOSIS — I83892 Varicose veins of left lower extremities with other complications: Secondary | ICD-10-CM

## 2023-05-24 DIAGNOSIS — I872 Venous insufficiency (chronic) (peripheral): Secondary | ICD-10-CM | POA: Diagnosis present

## 2023-05-24 DIAGNOSIS — I8393 Asymptomatic varicose veins of bilateral lower extremities: Secondary | ICD-10-CM

## 2023-05-24 NOTE — Progress Notes (Signed)
Patient ID: Ricky [name], male   DOB: [birth year], 67 y.o.   MRN: [id number]  Reason for Consult: Follow-up   Referred by Rebekah Chesterfield, NP  Subjective:     HPI:  Ricky [name] is a 67 y.o. male with history of left lower extremity swelling and varicose veins and has undergone stenting of May Thurner syndrome.  He has also had several bleeding episodes at the level of the ankle.  Currently does not wear compression stockings.  He states that he has pain overlying the varicose veins particularly on the left medial thigh and leg but has never had any bleeding from the large veins.  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  He is not on any blood thinners.  He states that the swelling is dramatically improved since stenting of the common iliac vein.  He does take aspirin and Plavix.  Past Medical History:  Diagnosis Date   Allergy    CAD (coronary artery disease)    Normal coronaries (2006)   Dyslipidemia    Hypertension    MI (myocardial infarction) ([facility]) 2007   Serrated adenoma of colon 04/2011   Family History  Problem Relation Age of Onset   Breast cancer Sister 54       deceased   Colon cancer Neg Hx    Liver disease Neg Hx    Past Surgical History:  Procedure Laterality Date   CARDIAC CATHETERIZATION     COLONOSCOPY  05/03/2011   Dr Rourk-serrated adenomas,hemorrhoids   COLONOSCOPY  05/10/2011   post-polypectomy bleed-reolution clip   COLONOSCOPY N/A 05/11/2018   Procedure: COLONOSCOPY;  Surgeon: Corbin Ade, MD;  [facility];  Service: Endoscopy;  Laterality: N/A;  10:30   INGUINAL HERNIA REPAIR  2007   left   INGUINAL HERNIA REPAIR  2002   right   LOWER EXTREMITY VENOGRAPHY N/A 11/16/2020   Procedure: LOWER EXTREMITY VENOGRAPHY;  Surgeon: Maeola Harman, MD;  [facility]: [facility];  Service: Cardiovascular;  Laterality: N/A;   PERIPHERAL VASCULAR INTERVENTION Left 11/16/2020   Procedure: PERIPHERAL  VASCULAR INTERVENTION;  Surgeon: Maeola Harman, MD;  Location: [facility];  Service: Cardiovascular;  Laterality: Left;  common iliac stent venous   POLYPECTOMY  05/11/2018   Procedure: POLYPECTOMY;  Surgeon: Corbin Ade, MD;  [facility];  Service: Endoscopy;;   VARICOSE VEIN SURGERY      Short Social History:  Social History   Tobacco Use   Smoking status: Former    Current packs/day: 0.00    Types: Cigarettes    Start date: 08/22/1994    Quit date: 08/22/1994    Years since quitting: 28.7   Smokeless tobacco: Never  Substance Use Topics   Alcohol use: No    Allergies  Allergen Reactions   Lipitor [Atorvastatin Calcium] Other (See Comments)    Joint pain    Current Outpatient Medications  Medication Sig Dispense Refill   aspirin EC 81 MG tablet Take 81 mg by mouth daily. Swallow whole.     azelastine (OPTIVAR) 0.05 % ophthalmic solution Place 1 drop into both eyes 2 (two) times daily as needed (irritation).     clopidogrel (PLAVIX) 75 MG tablet TAKE ONE (1) TABLET BY MOUTH EVERY DAY 30 tablet 6   erythromycin ophthalmic ointment Place 1 application into both eyes daily as needed (irritation).     ezetimibe (ZETIA) 10 MG tablet Take 10 mg  by mouth daily.     furosemide (LASIX) 20 MG tablet Take 20 mg by mouth daily.     ibuprofen (ADVIL) 200 MG tablet Take 200-400 mg by mouth every 8 (eight) hours as needed for headache.     metoprolol (LOPRESSOR) 50 MG tablet Take 50 mg by mouth daily.     Multiple Vitamins-Minerals (MULTIVITAMINS THER. W/MINERALS) TABS Take 1 tablet by mouth daily.     valsartan-hydrochlorothiazide (DIOVAN-HCT) 320-25 MG tablet Take 1 tablet by mouth daily.     No current facility-administered medications for this visit.    Review of Systems  Constitutional:  Constitutional negative. HENT: HENT negative.  Eyes: Eyes negative.  Respiratory: Respiratory negative.  Cardiovascular: Positive for leg swelling.  GI:  Gastrointestinal negative.  Musculoskeletal: Positive for leg pain.  Neurological: Neurological negative. Hematologic: Hematologic/lymphatic negative.  Psychiatric: Psychiatric negative.        Objective:  Objective   Vitals:   05/24/23 1458  BP: (!) 154/80  Pulse: 74  Resp: 20  Temp: 97.8 F (36.6 C)  SpO2: 97%  Weight: 165 lb (74.8 kg)  Height: 5\' 8"  (1.727 m)   Body mass index is 25.09 kg/m.  Physical Exam HENT:     Head: Normocephalic.     Nose: Nose normal.  Eyes:     Pupils: Pupils are equal, round, and reactive to light.  Cardiovascular:     Rate and Rhythm: Normal rate.     Pulses: Normal pulses.  Abdominal:     General: Abdomen is flat.  Skin:    Capillary Refill: Capillary refill takes less than 2 seconds.     Comments: Bilateral medial ankle skin changes consistent with C4 venous disease  Neurological:     General: No focal deficit present.     Mental Status: He is alert.  Psychiatric:        Mood and Affect: Mood normal.        Data: LEFT          Reflux NoRefluxReflux TimeDiameter cmsComments                                  Yes                                            +--------------+---------+------+-----------+------------+----------------+   CFV                    yes   >1 second                                +--------------+---------+------+-----------+------------+----------------+   FV prox       no        yes   >1 second                                +--------------+---------+------+-----------+------------+----------------+   FV mid                  yes   >1 second                                +--------------+---------+------+-----------+------------+----------------+  Popliteal              yes   >1 second                                +--------------+---------+------+-----------+------------+----------------+   GSV at [facility]              yes    >500 ms      1.01                        +--------------+---------+------+-----------+------------+----------------+   GSV prox thigh          yes    >500 ms      0.62    chronic  thrombus  +--------------+---------+------+-----------+------------+----------------+   GSV mid thigh           yes    >500 ms     0.447                       +--------------+---------+------+-----------+------------+----------------+   GSV dist thigh          yes    >500 ms     0.315                       +--------------+---------+------+-----------+------------+----------------+   GSV at knee             yes    >500 ms     0.448                       +--------------+---------+------+-----------+------------+----------------+   GSV prox calf           yes    >500 ms     0.406                       +--------------+---------+------+-----------+------------+----------------+   SSV Pop Fossa           yes    >500 ms     0.466    chronic  thrombus  +--------------+---------+------+-----------+------------+----------------+   SSV prox calf           yes    >500 ms     0.548                       +--------------+---------+------+-----------+------------+----------------+   SSV mid calf            yes    >500 ms     0.475    wall thickening    +--------------+---------+------+-----------+------------+----------------+      Summary:  Left:  - No evidence of deep vein thrombosis seen in the left lower extremity,  from the common femoral through the popliteal veins.  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   - Deep vein relfux in the CFV, FV, and popliteal vein.   - Superficial vein reflux in the SSV, SFJ, and GSV.      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  This is secondary to a very large refluxing great saphenous vein also has  refluxing small saphenous vein.  We will fit for thigh-high compression stockings which we discussed the timing to wear when he is out of bed and the need for elevation of his legs when  he is recumbent.  We discussed that any bleeding from his varicosities requires elevation of his legs and direct finger compression for hemostasis.  He demonstrates good understanding in the presence of his sister I will see him back in 3 months to discuss intervention for symptomatic varicose veins.    Maeola Harman MD [facility]

## 2023-09-13 ENCOUNTER — Ambulatory Visit: Payer: BC Managed Care – PPO | Admitting: Vascular Surgery

## 2023-12-20 ENCOUNTER — Ambulatory Visit: Payer: BC Managed Care – PPO | Admitting: Vascular Surgery

## 2024-03-06 ENCOUNTER — Encounter: Payer: Self-pay | Admitting: Vascular Surgery

## 2024-03-06 ENCOUNTER — Ambulatory Visit: Attending: Vascular Surgery | Admitting: Vascular Surgery

## 2024-03-06 VITALS — BP 142/76 | HR 62 | Temp 97.9°F | Ht 68.0 in | Wt 177.0 lb

## 2024-03-06 DIAGNOSIS — I83892 Varicose veins of left lower extremities with other complications: Secondary | ICD-10-CM

## 2024-03-06 NOTE — Progress Notes (Signed)
 Patient ID: Ricky [name], male   DOB: [birth year], 68 y.o.   MRN: [id number]  Reason for Consult: Follow-up   Referred by Renato Dorothey HERO, NP  Subjective:     HPI:  Ricky [name] is a 68 y.o. male has a history of symptomatic varicosities and swelling of the left lower extremity greater than the right lower extremity.  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  In the past he had several episodes of bleeding.  He has been compliant with thigh-high compression stockings.  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  States that swelling continues to be much improved from prior to stenting of his left common iliac vein.  Past Medical History:  Diagnosis Date   Allergy    CAD (coronary artery disease)    Normal coronaries (2006)   Dyslipidemia    Hypertension    MI (myocardial infarction) (HCC) 2007   Serrated adenoma of colon 04/2011   Family History  Problem Relation Age of Onset   Breast cancer Sister 61       deceased   Colon cancer Neg Hx    Liver disease Neg Hx    Past Surgical History:  Procedure Laterality Date   CARDIAC CATHETERIZATION     COLONOSCOPY  05/03/2011   Dr Rourk-serrated adenomas,hemorrhoids   COLONOSCOPY  05/10/2011   post-polypectomy bleed-reolution clip   COLONOSCOPY N/A 05/11/2018   Procedure: COLONOSCOPY;  Surgeon: Shaaron Lamar HERO, MD;  Location: [facility];  Service: Endoscopy;  Laterality: N/A;  10:30   INGUINAL HERNIA REPAIR  2007   left   INGUINAL HERNIA REPAIR  2002   right   LOWER EXTREMITY VENOGRAPHY N/A 11/16/2020   Procedure: LOWER EXTREMITY VENOGRAPHY;  Surgeon: Sheree Penne Bruckner, MD;  Location: [facility] INVASIVE CV LAB;  Service: Cardiovascular;  Laterality: N/A;   PERIPHERAL VASCULAR INTERVENTION Left 11/16/2020   Procedure: PERIPHERAL VASCULAR INTERVENTION;  Surgeon: Sheree Penne Bruckner, MD;  Location: [facility] INVASIVE CV LAB;  Service: Cardiovascular;  Laterality: Left;  common iliac stent venous   POLYPECTOMY  05/11/2018   Procedure: POLYPECTOMY;  Surgeon: Shaaron Lamar HERO, MD;  Location: [facility];  Service: Endoscopy;;   VARICOSE VEIN SURGERY      Short Social History:  Social History   Tobacco Use   Smoking status: Former    Current packs/day: 0.00    Types: Cigarettes    Start date: 08/22/1994    Quit date: 08/22/1994    Years since quitting: 29.5   Smokeless tobacco: Never  Substance Use Topics   Alcohol use: No    Allergies  Allergen Reactions   Lipitor [Atorvastatin Calcium] Other (See Comments)    Joint pain    Current Outpatient Medications  Medication Sig Dispense Refill   aspirin  EC 81 MG tablet Take 81 mg by mouth daily. Swallow whole.     azelastine (OPTIVAR) 0.05 % ophthalmic solution Place 1 drop into both eyes 2 (two) times daily as needed (irritation).     clopidogrel  (PLAVIX ) 75 MG tablet TAKE ONE (1) TABLET BY MOUTH EVERY DAY 30 tablet 6   erythromycin ophthalmic ointment Place 1 application into both eyes daily as needed (irritation).     ezetimibe (ZETIA) 10 MG tablet Take 10 mg by mouth daily.     furosemide (  LASIX) 20 MG tablet Take 20 mg by mouth daily.     ibuprofen (ADVIL) 200 MG tablet Take 200-400 mg by mouth every 8 (eight) hours as needed for headache.     metoprolol (LOPRESSOR) 50 MG tablet Take 50 mg by mouth daily.     Multiple Vitamins-Minerals (MULTIVITAMINS THER. W/MINERALS) TABS Take 1 tablet by mouth daily.     valsartan-hydrochlorothiazide (DIOVAN-HCT) 320-25 MG tablet Take 1 tablet by mouth daily.     No current facility-administered medications for this visit.    Review of Systems  Constitutional:  Constitutional negative. HENT: HENT negative.  Eyes: Eyes negative.  Cardiovascular: Positive for leg swelling.  GI: Gastrointestinal negative.  Musculoskeletal: Positive for leg pain.  Neurological:  Neurological negative. Hematologic: Positive for bruises/bleeds easily.  Psychiatric: Psychiatric negative.        Objective:  Objective   Vitals:   03/06/24 0947  BP: (!) 142/76  Pulse: 62  Temp: 97.9 F (36.6 C)  SpO2: 95%  Weight: 177 lb (80.3 kg)  Height: 5' 8 (1.727 m)   Body mass index is 26.91 kg/m.  Physical Exam HENT:     Head: Normocephalic.     Nose: Nose normal.  Eyes:     Pupils: Pupils are equal, round, and reactive to light.  Pulmonary:     Effort: Pulmonary effort is normal.  Musculoskeletal:     Right lower leg: Edema present.     Left lower leg: Edema present.  Skin:    General: Skin is warm.     Capillary Refill: Capillary refill takes less than 2 seconds.  Neurological:     General: No focal deficit present.     Mental Status: He is alert.  Psychiatric:        Mood and Affect: Mood normal.        Thought Content: Thought content normal.        Judgment: Judgment normal.        Data: LEFT          Reflux NoRefluxReflux TimeDiameter cmsComments                                  Yes                                            +--------------+---------+------+-----------+------------+----------------+   CFV                    yes   >1 second                                +--------------+---------+------+-----------+------------+----------------+   FV prox       no        yes   >1 second                                +--------------+---------+------+-----------+------------+----------------+   FV mid                  yes   >1 second                                +--------------+---------+------+-----------+------------+----------------+  Popliteal              yes   >1 second                                +--------------+---------+------+-----------+------------+----------------+   GSV at [facility]              yes    >500 ms      1.01                        +--------------+---------+------+-----------+------------+----------------+   GSV prox thigh          yes    >500 ms      0.62    chronic  thrombus  +--------------+---------+------+-----------+------------+----------------+   GSV mid thigh           yes    >500 ms     0.447                       +--------------+---------+------+-----------+------------+----------------+   GSV dist thigh          yes    >500 ms     0.315                       +--------------+---------+------+-----------+------------+----------------+   GSV at knee             yes    >500 ms     0.448                       +--------------+---------+------+-----------+------------+----------------+   GSV prox calf           yes    >500 ms     0.406                       +--------------+---------+------+-----------+------------+----------------+   SSV Pop Fossa           yes    >500 ms     0.466    chronic  thrombus  +--------------+---------+------+-----------+------------+----------------+   SSV prox calf           yes    >500 ms     0.548                       +--------------+---------+------+-----------+------------+----------------+   SSV mid calf            yes    >500 ms     0.475    wall thickening    +--------------+---------+------+-----------+------------+----------------+      Summary:  Left:  - No evidence of deep vein thrombosis seen in the left lower extremity,  from the common femoral through the popliteal veins.  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   - Deep vein relfux in the CFV, FV, and popliteal vein.   - Superficial vein reflux in the SSV, SFJ, and GSV.      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   I evaluated the great saphenous vein at bedside which does have some webbing but is very large and gives rise to multiple varicosities.  There is also an anterior excessively saphenous vein which  hopefully we can ablate at the time of procedure as well.  We have discussed proceeding with left greater saphenous vein ablation from about the mid thigh up to the level of a few centimeters proximal to the saphenofemoral junction and will need associated stab phlebectomy for greater than 20 sites.  We discussed the risk benefits and alternatives as well as need for follow-up and we can discuss treatment of the right lower extremity moving forward after he recovers.  All questions were answered with the patient in the presence of the sister and they demonstrate good understanding.     Penne Lonni Colorado MD [facility]

## 2024-03-26 ENCOUNTER — Other Ambulatory Visit: Payer: Self-pay | Admitting: *Deleted

## 2024-03-26 DIAGNOSIS — I83892 Varicose veins of left lower extremities with other complications: Secondary | ICD-10-CM

## 2024-05-01 ENCOUNTER — Other Ambulatory Visit: Payer: Self-pay | Admitting: *Deleted

## 2024-05-01 MED ORDER — LORAZEPAM 1 MG PO TABS: ORAL_TABLET | ORAL | 0 refills | Status: AC

## 2024-05-09 ENCOUNTER — Encounter: Payer: Self-pay | Admitting: Vascular Surgery

## 2024-05-09 ENCOUNTER — Ambulatory Visit: Attending: Vascular Surgery | Admitting: Vascular Surgery

## 2024-05-09 VITALS — BP 131/75 | HR 53 | Temp 97.8°F | Resp 18 | Ht 67.0 in | Wt 177.0 lb

## 2024-05-09 DIAGNOSIS — I83892 Varicose veins of left lower extremities with other complications: Secondary | ICD-10-CM | POA: Diagnosis not present

## 2024-05-09 HISTORY — PX: VEIN SURGERY: SHX48

## 2024-05-09 NOTE — Progress Notes (Signed)
   Laser Ablation Procedure    Date: 05/09/2024 Ricky [name] DOB:1956-08-02 Consent signed: Yes     Surgeon: Dr. [name] Procedure: Laser Ablation: left Greater Saphenous Vein BP 131/75 (BP Location: Right Arm, Patient Position: Sitting, Cuff Size: Normal)   Pulse (!) 53   Temp 97.8 F (36.6 C) (Temporal)   Resp 18   Ht 5' 7 (1.702 m)   Wt 177 lb (80.3 kg)   SpO2 97%   BMI 27.72 kg/m  Tumescent Anesthesia: 250 cc 0.9% NaCl with 50 cc Lidocaine  HCL 1%  and 15 cc 8.4% NaHCO3 Local Anesthesia: 19 cc Lidocaine  HCL and NaHCO3 (ratio 2:1) 7 watts continuous mode  Stab Phlebectomy: >20 Sites: Thigh and Calf Patient tolerated procedure well  Notes: All staff members wore facial masks . Pt had 1 mg of ativan  at 06:30 am prior to surgical procedure    Description of Procedure: After marking the course of the secondary varicosities, the patient was placed on the operating table in the supine position, and the left leg was prepped and draped in sterile fashion.     stab phlebectomies, local anesthetic was administered at the previously marked varicosities, and tumescent anesthesia was administered around the vessels.  Greater than 20 stab wounds were made using the tip of an 11 blade. And using the vein hook, the phlebectomies were performed using a hemostat to avulse the varicosities.  Adequate hemostasis was achieved.   Steri strips were applied to the stab wounds and ABD pads and thigh high compression stockings were applied.  Ace wrap bandages were applied over the phlebectomy sites and at the top of the saphenofemoral junction. Blood loss was less than 15 cc.  Discharge instructions reviewed with patient and hardcopy of discharge instructions given to patient to take home. The patient was wheeled out of the operating room having tolerated the procedure well.

## 2024-05-09 NOTE — Progress Notes (Signed)
 Patient name: Ricky [name] MRN: [id number] DOB: 07/31/1956 Sex: male  REASON FOR VISIT: Here for treatment of symptomatic left lower extremity varicosities  HPI: Ricky [name] is a 68 y.o. 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  Multiple previous episodes of bleeding from varicosities and telangiectasias.  He has been compliant with thigh-high compression stockings.  Current Outpatient Medications  Medication Sig Dispense Refill   aspirin  EC 81 MG tablet Take 81 mg by mouth daily. Swallow whole.     azelastine (OPTIVAR) 0.05 % ophthalmic solution Place 1 drop into both eyes 2 (two) times daily as needed (irritation).     clopidogrel  (PLAVIX ) 75 MG tablet TAKE ONE (1) TABLET BY MOUTH EVERY DAY 30 tablet 6   erythromycin ophthalmic ointment Place 1 application into both eyes daily as needed (irritation).     ezetimibe (ZETIA) 10 MG tablet Take 10 mg by mouth daily.     furosemide (LASIX) 20 MG tablet Take 20 mg by mouth daily.     ibuprofen (ADVIL) 200 MG tablet Take 200-400 mg by mouth every 8 (eight) hours as needed for headache.     LORazepam  (ATIVAN ) 1 MG tablet Take 1 tablet 30 to 60 minutes prior to leaving house on day of office surgery.  Bring second tablet with you to office on day of office surgery. 2 tablet 0   metoprolol (LOPRESSOR) 50 MG tablet Take 50 mg by mouth daily.     Multiple Vitamins-Minerals (MULTIVITAMINS THER. W/MINERALS) TABS Take 1 tablet by mouth daily.     valsartan-hydrochlorothiazide (DIOVAN-HCT) 320-25 MG tablet Take 1 tablet by mouth daily.     No current facility-administered medications for this visit.    PHYSICAL EXAM: Vitals:   05/09/24 0847  BP: 131/75  Pulse: (!) 53  Resp: 18  Temp: 97.8 F (36.6 C)  SpO2: 97%   Awake alert and oriented Nonlabored respirations Left lower extremity varicosities marked with the patient in the standing position   PROCEDURE: Ultrasound-guided  cannulation left greater saphenous vein with attempted ablation, stab phlebectomy left medial and lateral thigh and leg greater than 20 sites  TECHNIQUE: Ricky [name] was taken to the procedure room where he was initially evaluated in the standing position and multiple varicosities over the medial lateral thighs lateral leg were marked.  He was then laid supine and sterilely prepped and draped in the left lower extremity in the usual fashion and a timeout was called.  We began using ultrasound identified what appeared to be the great saphenous vein and this was quite tortuous.  I made 3 separate access sites with micropuncture needle and wire using ultrasound guidance and was able to get access into the great saphenous vein but was unable to pass a wire.  Ultimately appeared that the great saphenous vein was occluded and this was different than my previous examination.  I evaluated for an anterior sensory saphenous vein that was visible on recent office visit but this only was patent until the mid thigh and then was occluded.  After attempting to cannulate the great saphenous vein 3 separate places I elected that he would not be suitable to proceed with ablation.  We turned our attention to the multiple varicosities of the medial and lateral thighs and medial and lateral legs.  The areas overlying the veins that had been marked were anesthetized 1% lidocaine  and tumescent anesthesia was instilled and greater than 20  small microphlebectomy incisions were created and the veins were grasped with crochet hook and removed with hemostats.  At completion I again evaluated with ultrasound for any ablative or veins but did not identify a large anterior sensory saphenous vein in the great saphenous vein did appear thrombosed.  As such a sterile wrap was placed in the patient's leg.  He tolerated the procedure without any complication.    Plan will be for follow-up in 2 weeks with postablation duplex given that we did access  the saphenous vein in multiple sites.  We can consider evaluation of the right lower extremity when he has recovered from the left.  [name] [facility] [place] [phone]

## 2024-05-10 ENCOUNTER — Other Ambulatory Visit: Payer: Self-pay

## 2024-05-10 DIAGNOSIS — I83892 Varicose veins of left lower extremities with other complications: Secondary | ICD-10-CM

## 2024-05-23 ENCOUNTER — Ambulatory Visit (HOSPITAL_COMMUNITY)
Admission: RE | Admit: 2024-05-23 | Discharge: 2024-05-23 | Disposition: A | Discharge status: Home / Self Care | Source: Ambulatory Visit | Attending: Vascular Surgery | Admitting: Vascular Surgery

## 2024-05-23 ENCOUNTER — Ambulatory Visit (INDEPENDENT_AMBULATORY_CARE_PROVIDER_SITE_OTHER): Admitting: Vascular Surgery

## 2024-05-23 ENCOUNTER — Other Ambulatory Visit: Admitting: Vascular Surgery

## 2024-05-23 VITALS — BP 112/74 | HR 96 | Temp 98.3°F | Wt 173.0 lb

## 2024-05-23 DIAGNOSIS — I83892 Varicose veins of left lower extremities with other complications: Secondary | ICD-10-CM

## 2024-05-23 NOTE — Progress Notes (Signed)
 Subjective:     Patient ID: Ricky [name], male   DOB: 1955-11-27, 68 y.o.   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  He also had pain overlying the varicosities.  He recently underwent attempted ablation of the left greater saphenous vein and ultimately stab phlebectomy of greater than 20 sites of the left medial and lateral thigh and leg.  He states that his pain has been very well-controlled but he does have some bruising.  He does not complain of the right lower extremity varicosities.   Review of Systems As above    Objective:   Physical Exam Vitals:   05/23/24 1007  BP: 112/74  Pulse: 96  Temp: 98.3 F (36.8 C)  SpO2: 98%        LEFT          Reflux NoRefluxReflux TimeDiameter cmsComments                                  Yes                                            +--------------+---------+------+-----------+------------+----------------+   CFV                                                Patent             +--------------+---------+------+-----------+------------+----------------+   FV mid                                              Patent             +--------------+---------+------+-----------+------------+----------------+   Popliteal                                          Patent             +--------------+---------+------+-----------+------------+----------------+   GSV prox thigh          yes    >500 ms              chronic  thrombus  +--------------+---------+------+-----------+------------+----------------+   GSV mid thigh           yes    >500 ms              chronic  thrombus  +--------------+---------+------+-----------+------------+----------------+   SSV prox calf                                       chronic  thrombus   +--------------+---------+------+-----------+------------+----------------+          Summary:  Left:  - No evidence of deep vein thrombosis from the common femoral through the  popliteal veins.  - Chronic appearing thrombus observed from the proximal to mid great  saphenous vein, incompetence demonstrated.  The great saphenous vein at the  distal thigh and knee is not well visualized.  - Chronic thrombus in the small saphenous vein.  - Thrombosed varicosities observed at the level of the distal thigh and  medial knee.        Assessment/plan:     68 year old male post attempted ablation of the left greater saphenous vein with stab phlebectomy greater than 20 symptomatic varicosities.  We discussed that his greater saphenous vein remains incompetent by today's duplex hopefully this will not lead to any further issues down the line as he did have chronic webbing and I was unable to pass a wire to ablate the vein despite multiple cannulation sites.  He is healing well with expected postoperative bruising given the amount of microphlebectomy sites.  He also has varicosities of the right lower extremity however at this time he remains asymptomatic.  We discussed continued compression stockings and follow-up as needed.      Keyunna Coco C. Sheree, MD [facility] [place] Office: [phone] Pager: [phone]

## 2024-06-06 ENCOUNTER — Encounter: Admitting: Vascular Surgery

## 2024-06-06 ENCOUNTER — Encounter (HOSPITAL_COMMUNITY)
